# Patient Record
Sex: Male | Born: 1940
Health system: Midwestern US, Academic
[De-identification: ages and names within clinical notes are randomized; demographics above are authoritative.]

---

## 2015-01-19 IMAGING — CT Pelvis^1_PELVIS_WITHOUT (Adult)
1 series · 15 of 32 positions shown, 19 images · non-contrast
Comparison: none

CT OF THE PELVIS
TECHNIQUE: Multiple standard axial reference planes without contrast.

[Series 2: pelvis without 5.0 soft tissue · axial · non-contrast · 0.71mm/px · z∈[-335,-35]mm · 15 of 68 slices shown, 19 images]
[im 5/68  soft-tissue]
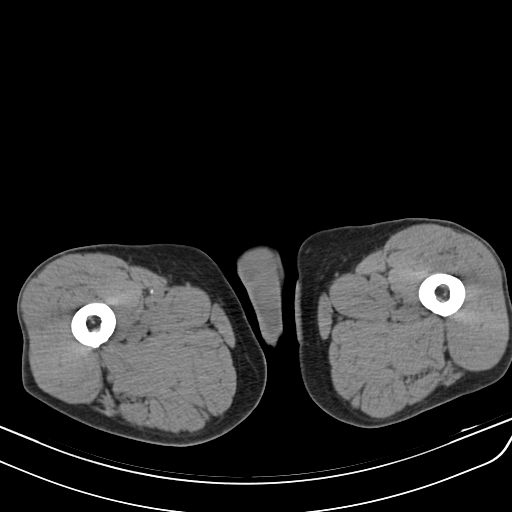
[im 5/68  bone]
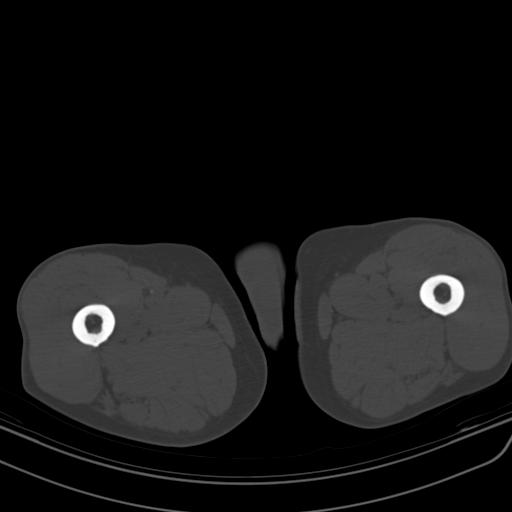
[im 9/68  soft-tissue]
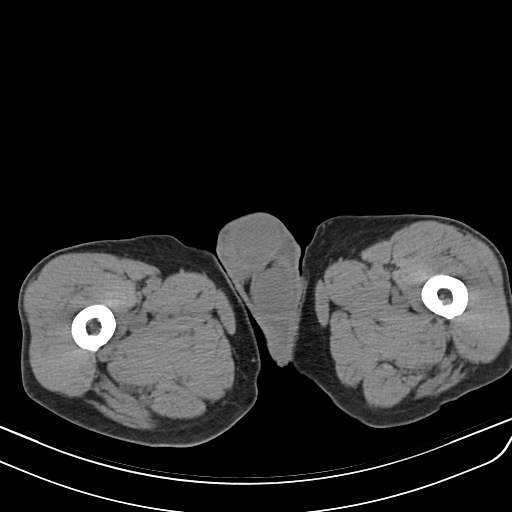
[im 13/68  soft-tissue]
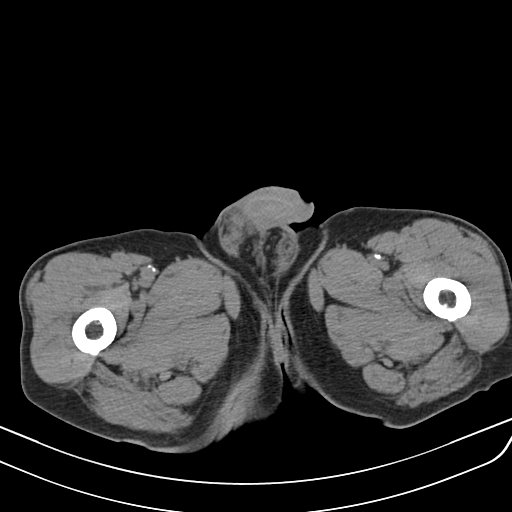
[im 20/68  soft-tissue]
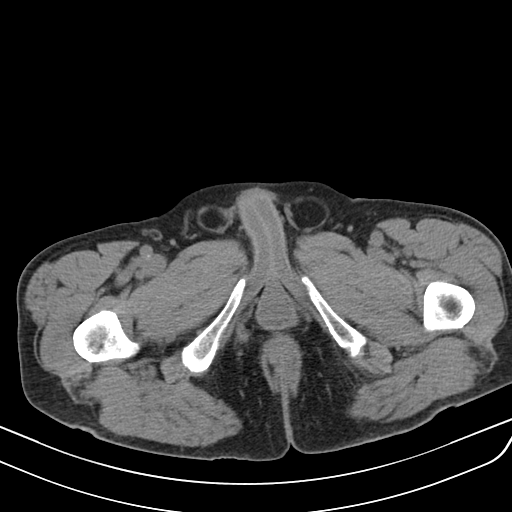
[im 24/68  soft-tissue]
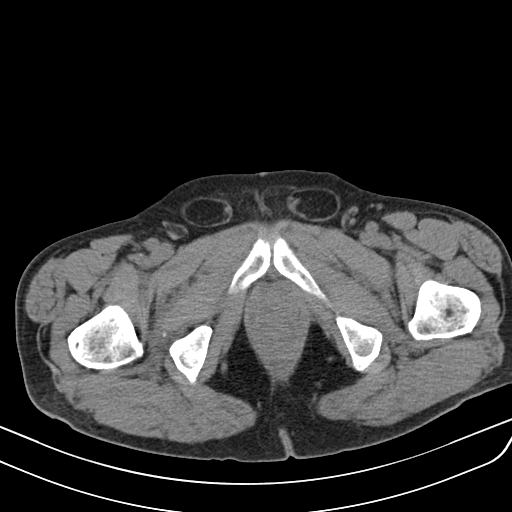
[im 29/68  soft-tissue]
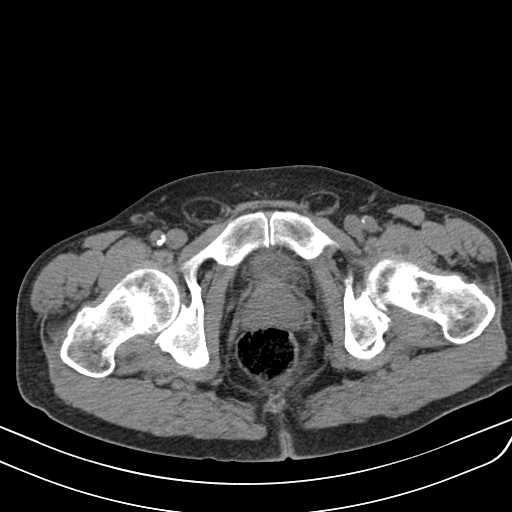
[im 35/68  soft-tissue]
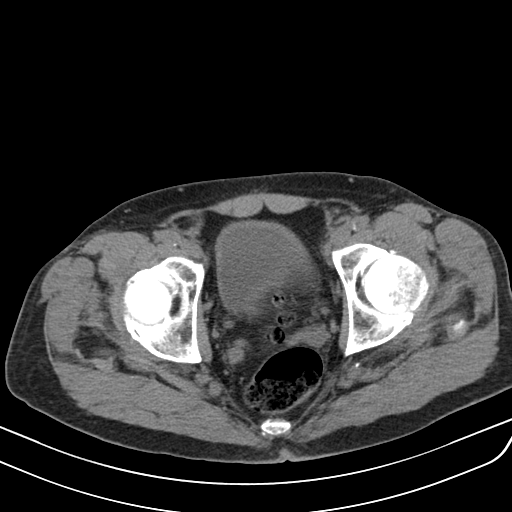
[im 39/68  soft-tissue]
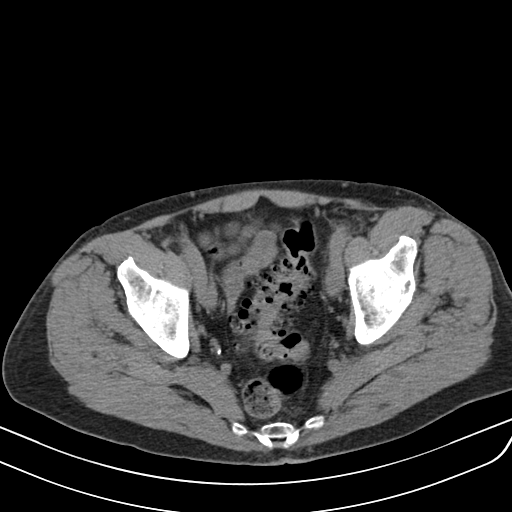
[im 44/68  soft-tissue]
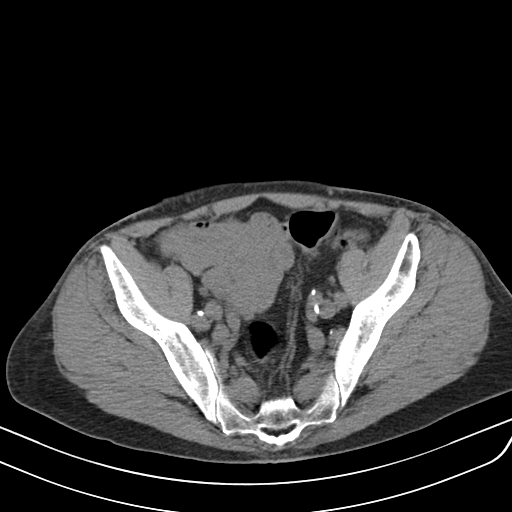
[im 44/68  bone]
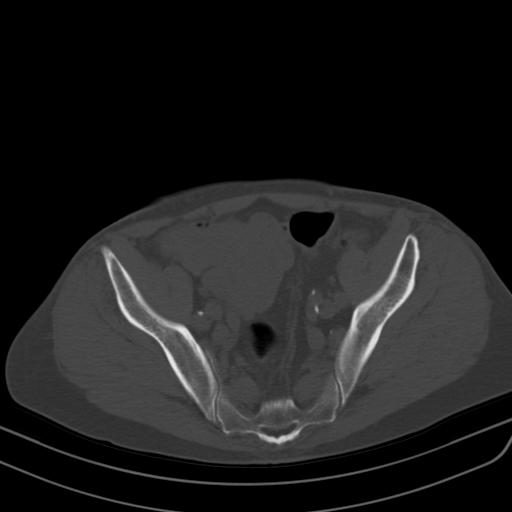
[im 48/68  soft-tissue]
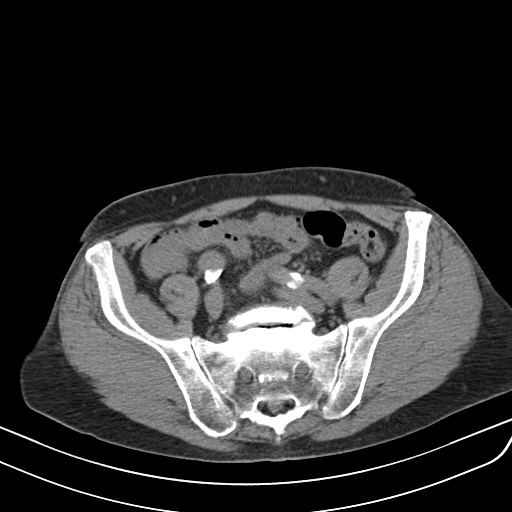
[im 55/68  soft-tissue]
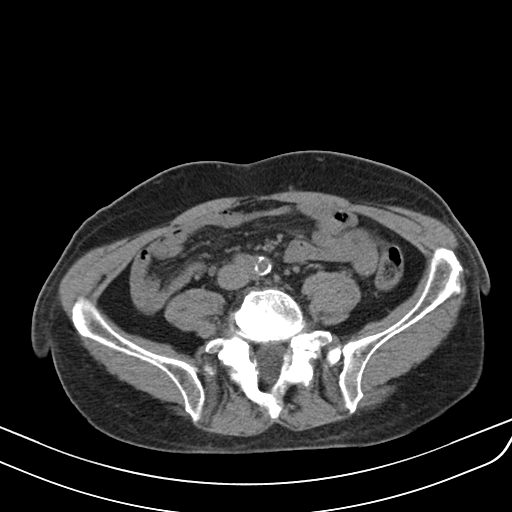
[im 59/68  soft-tissue]
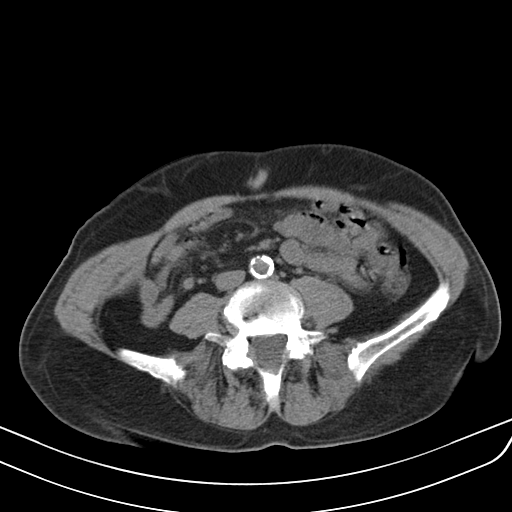
[im 59/68  lung]
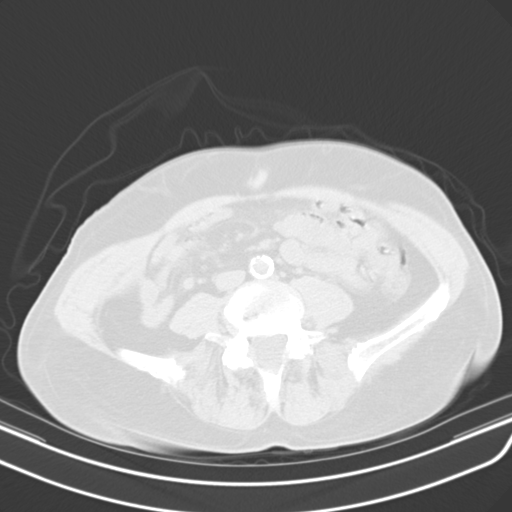
[im 61/68  lung]
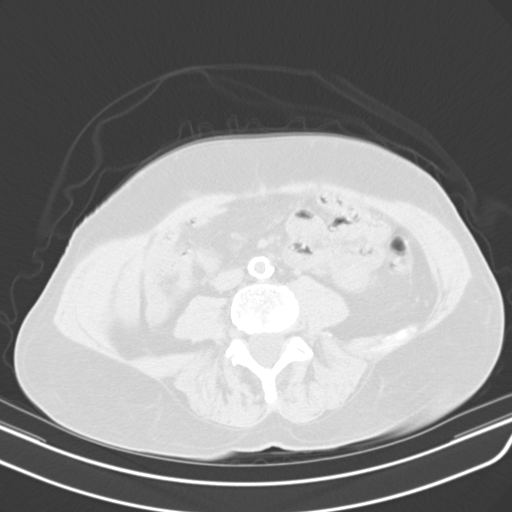
[im 63/68  soft-tissue]
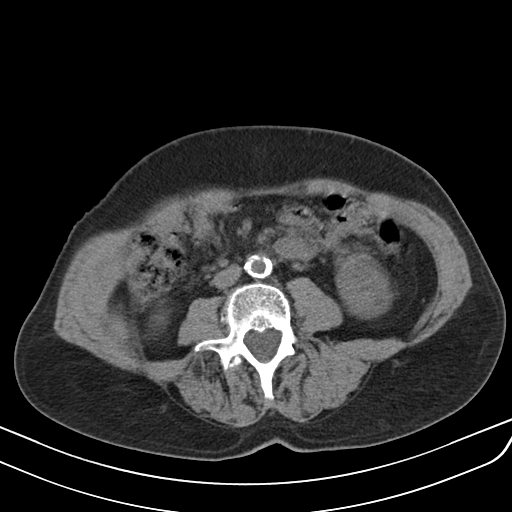
[im 63/68  lung]
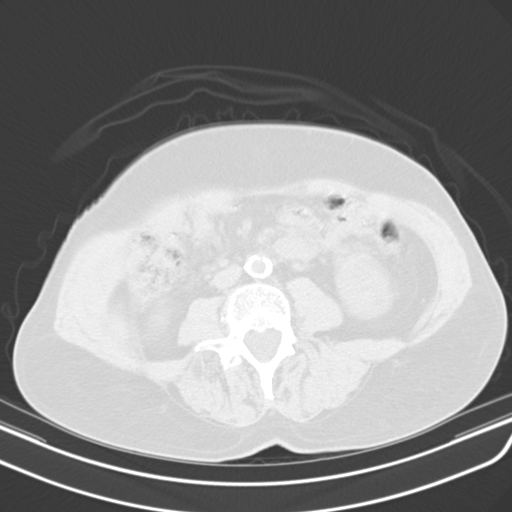
[im 65/68  lung]
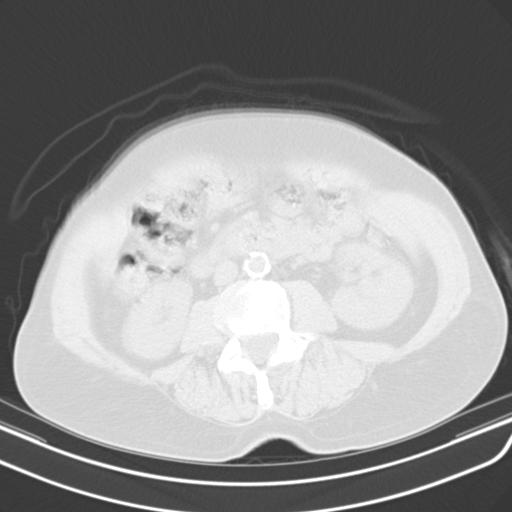

[15 of 32 positions shown; findings below may reference images not displayed]

IMPRESSION: Persistent increased soft tissue density at the level of the anus similar to
the past study
New small 7 mm gas filled pockets along the medial aspect of the left
buttocks consistent with a small abscess.
No larger fluid collection is seen.

INDICATIONS:  Perirectal abscess.
FINDINGS: There are stable moderate to severe sigmoid diverticulosis.
No acute inflammatory changes of bowel are seen.
There is a partially defined increased density in the perianal region similar
in size to the previous examination from February 15, 2014.
There is a 7 mm gas pocket identified along the medial aspect the left
buttocks compatible with a small gas filled abscess.
No larger fluid collection is seen.
There is mild prostatic enlargement.
This indents the bladder base.
There is marked disc space narrowing and sclerosis opposing the bony margins
at L4-5
There is vacuum formation at L5-S1 with grade 1 anterolisthesis and bilateral
isthmus defect.
There is spina bifida occulta of S1.
There is dense atheromatous disease of the aorta and its branches.
No free intraperitoneal air or fluid is seen.
No focal mesenteric edema or adenopathy is seen.
There are stable small-to-moderate sized fat containing uncomplicated
inguinal hernias.

Job: [id number]

Tech Notes: PERIRECTAL ABSCESS LEFT SIDE X [id number] -AK

## 2016-12-09 IMAGING — CR PELVIS
3 series · 3 of 3 positions shown · non-contrast
Comparison: none

[pelvis]
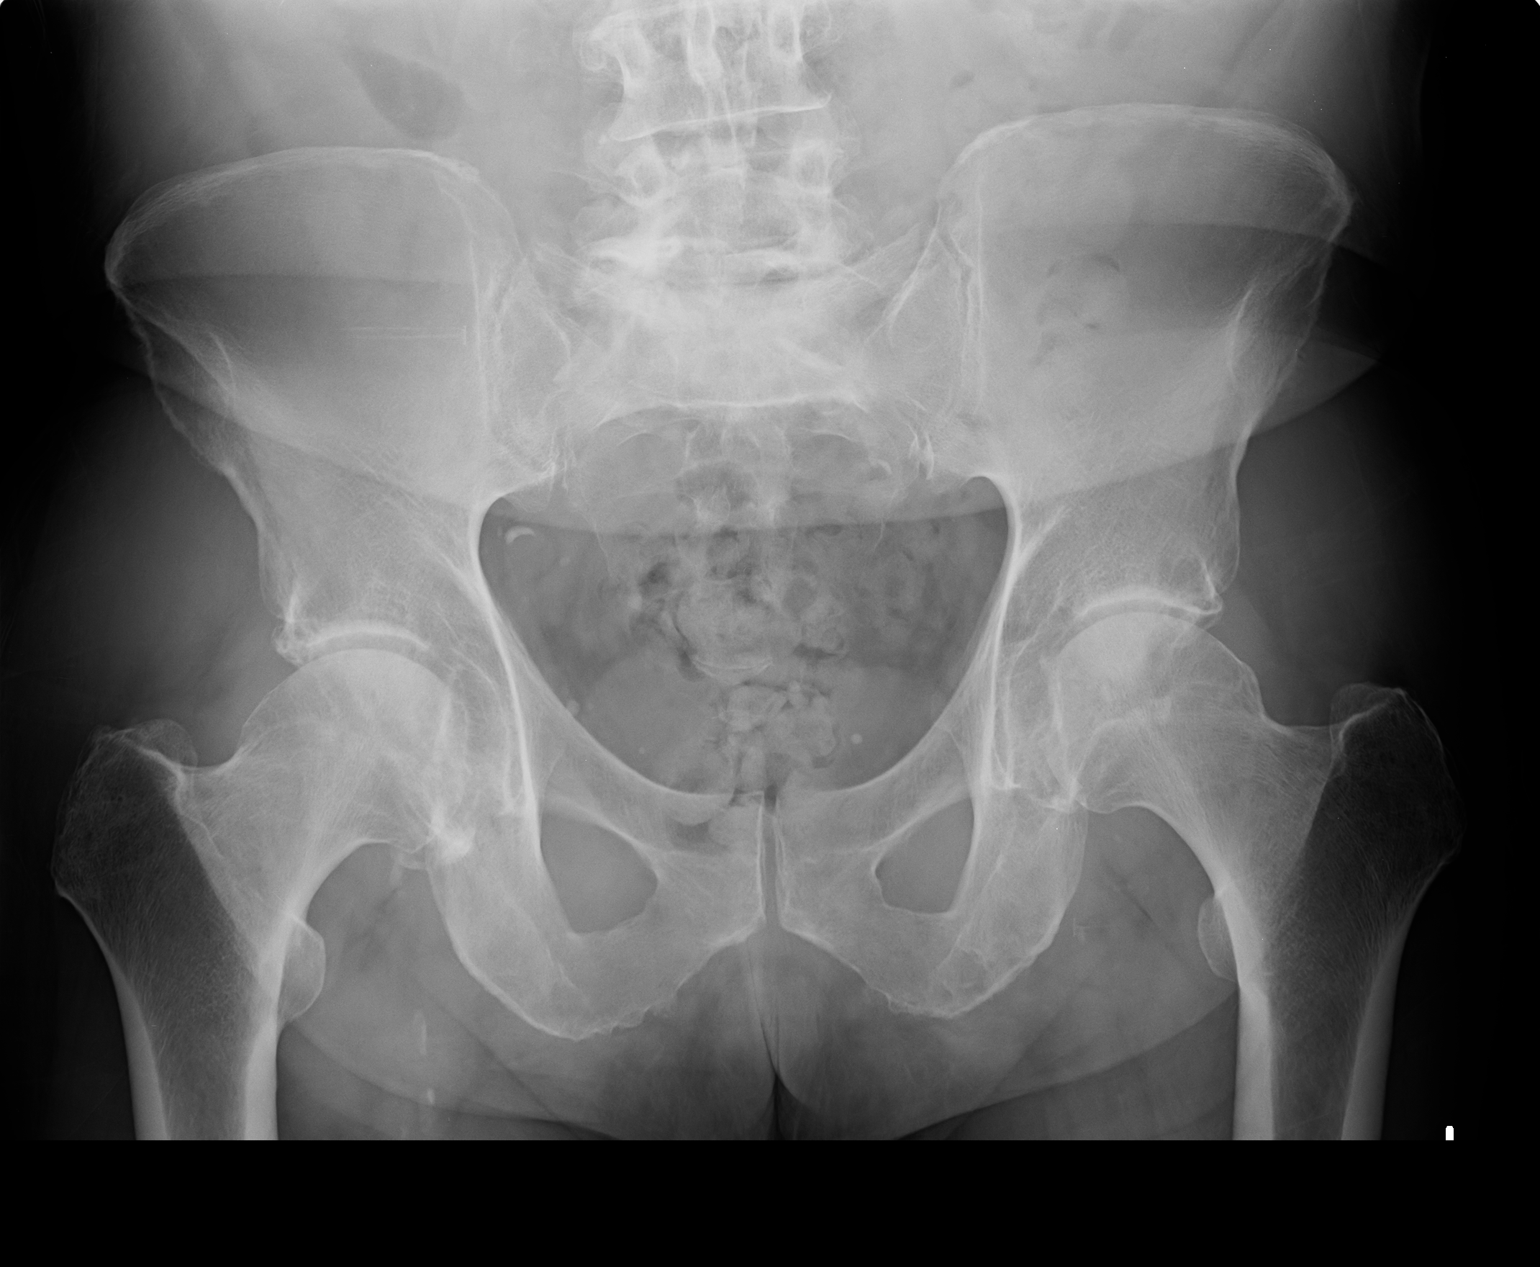

[hip ap]
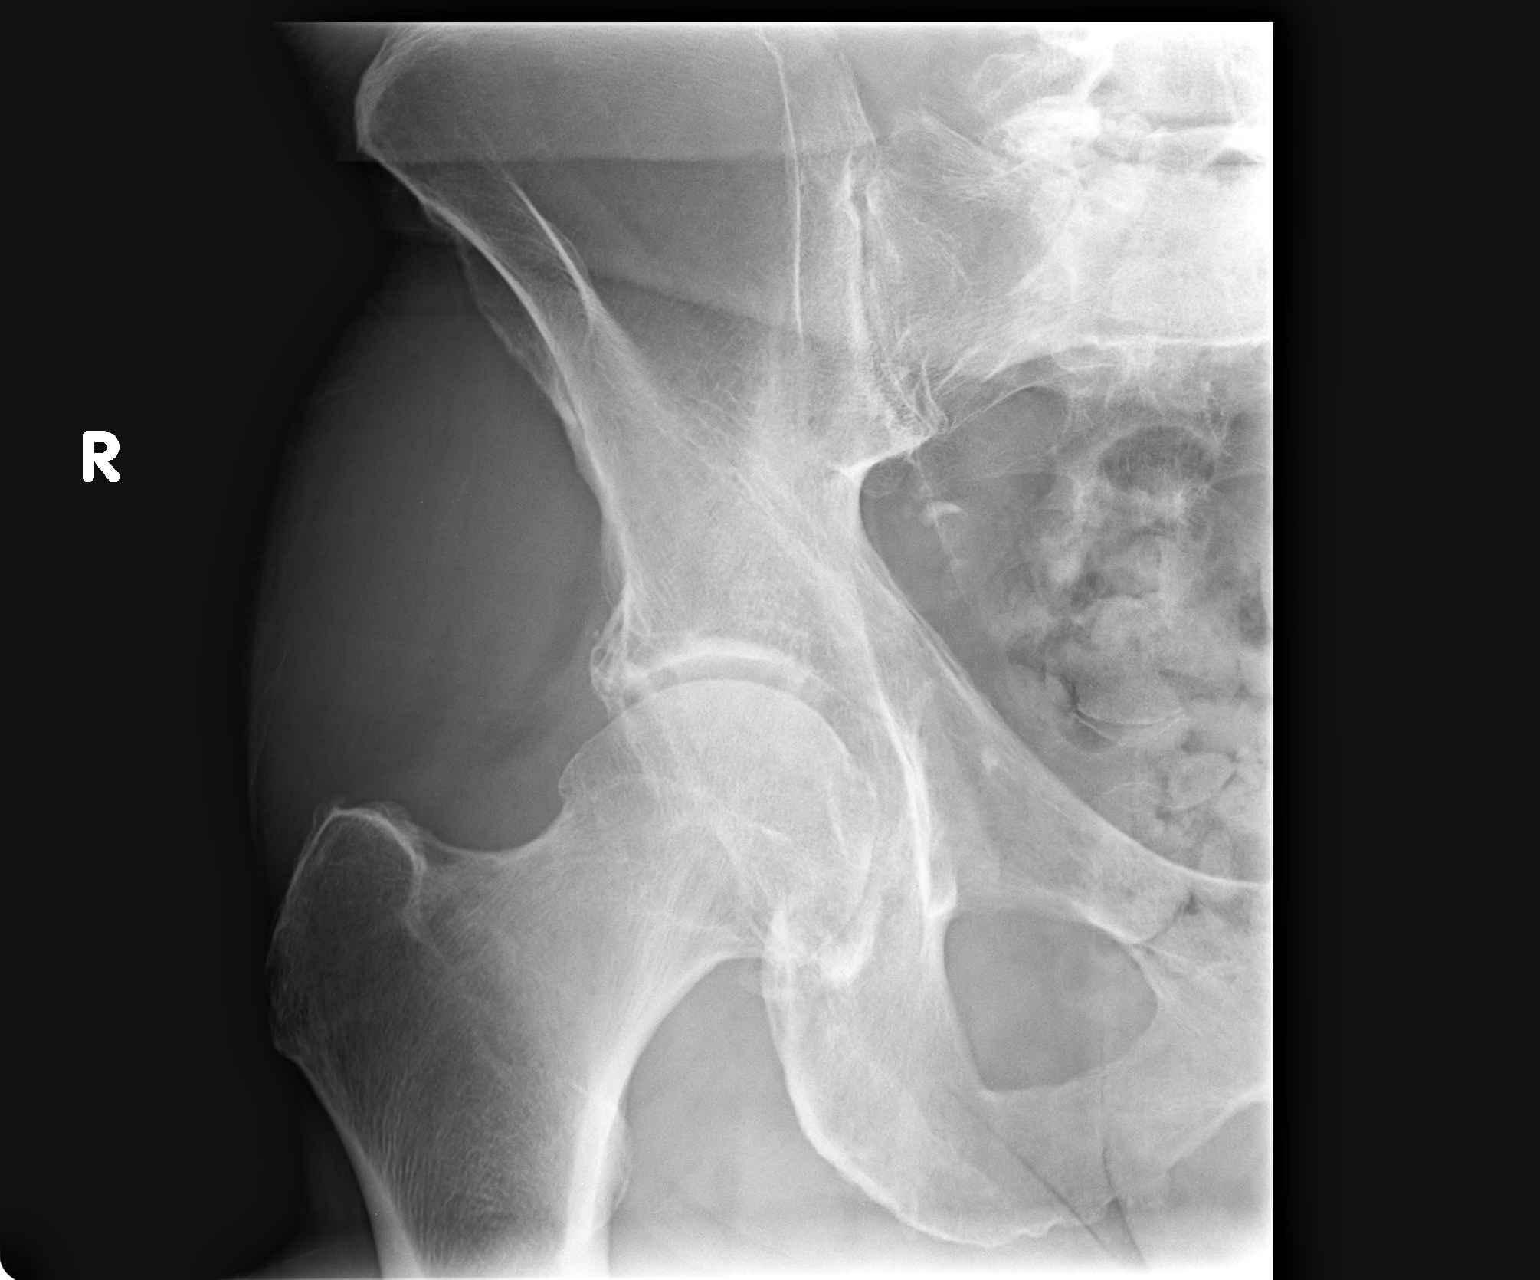

[hip frog]
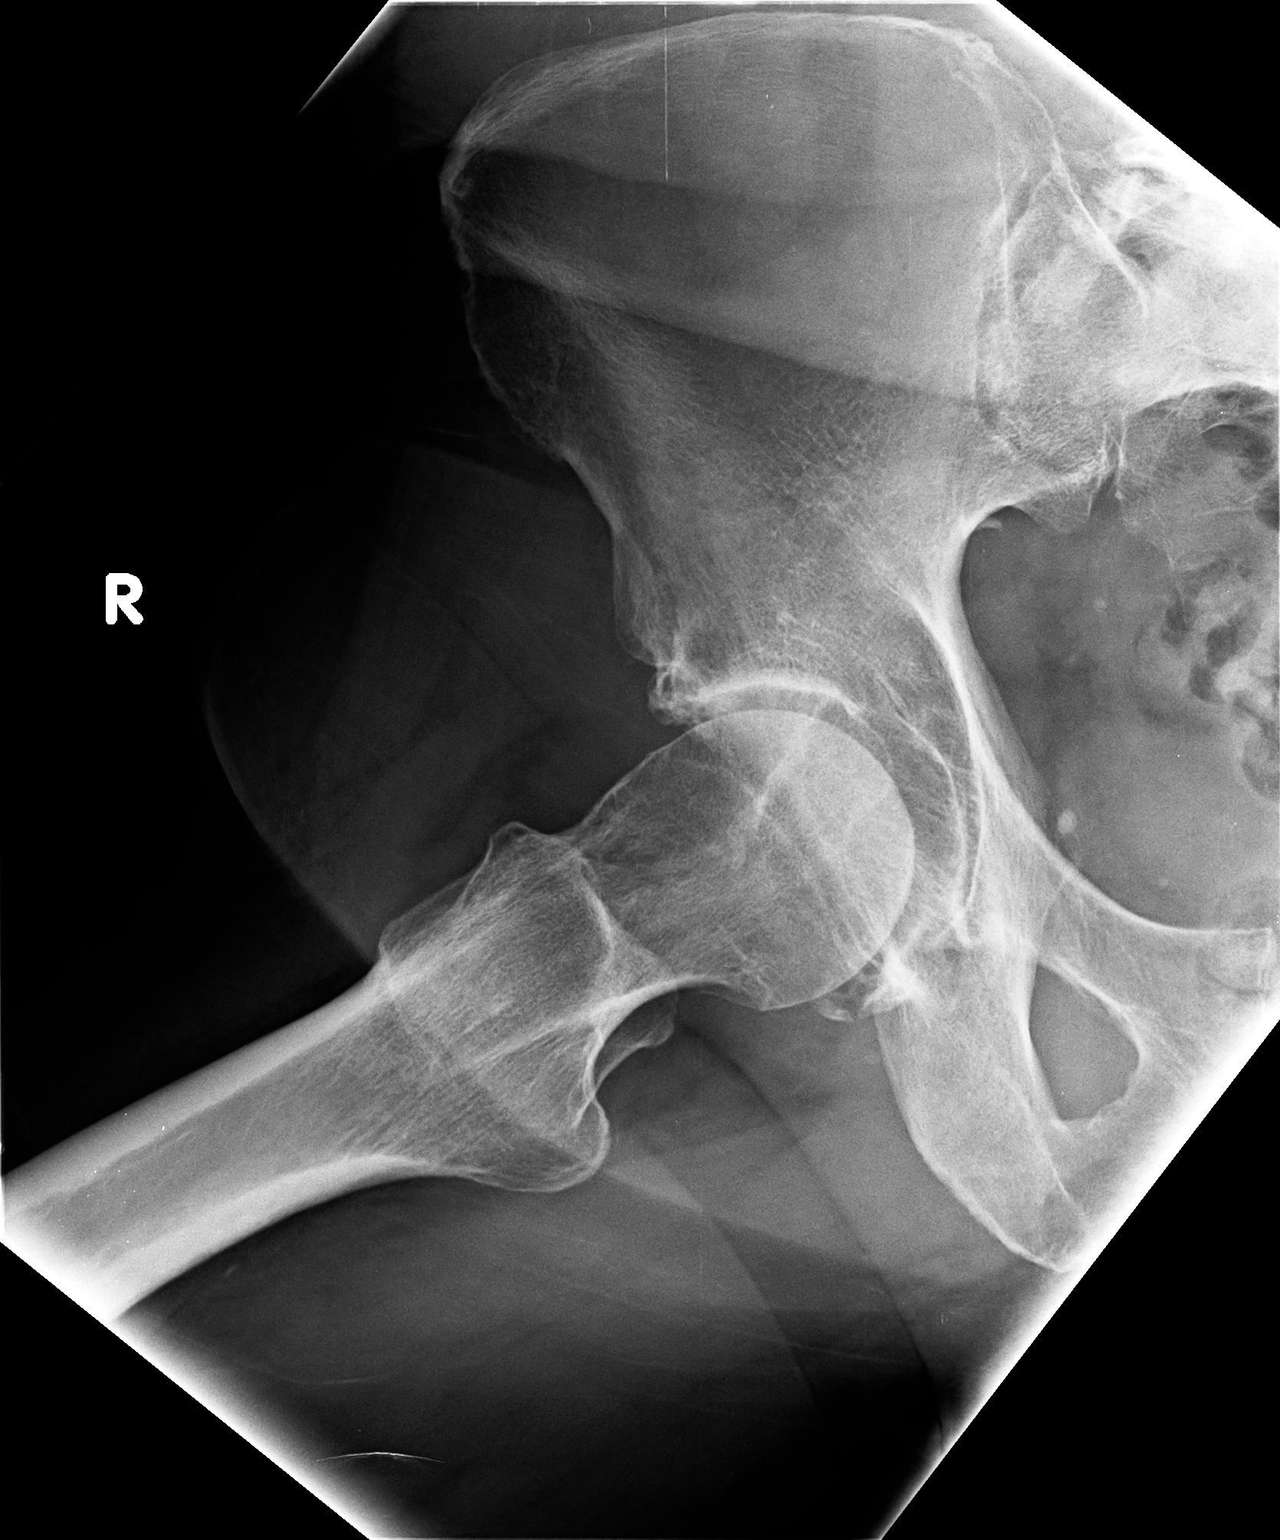

[3 of 3 positions shown; findings below may reference images not displayed]

EXAM

Right hip.

INDICATION

TENDINTIS RIGHT BUTTOCK
TENDINTIS RIGHT BUTTOCK

FINDINGS

Two views of the right hip were obtained.

There are moderately sized osteophytes of the right acetabulum and right femoral head. There is no
evidence of fracture or dislocation.

IMPRESSION

There is moderate osteoarthrosis of the right hip. No fracture or acute appearing abnormality is
identified.

## 2017-12-17 IMAGING — CR LOW_EXM
3 series · 3 of 3 positions shown · non-contrast
Comparison: none

[foot]
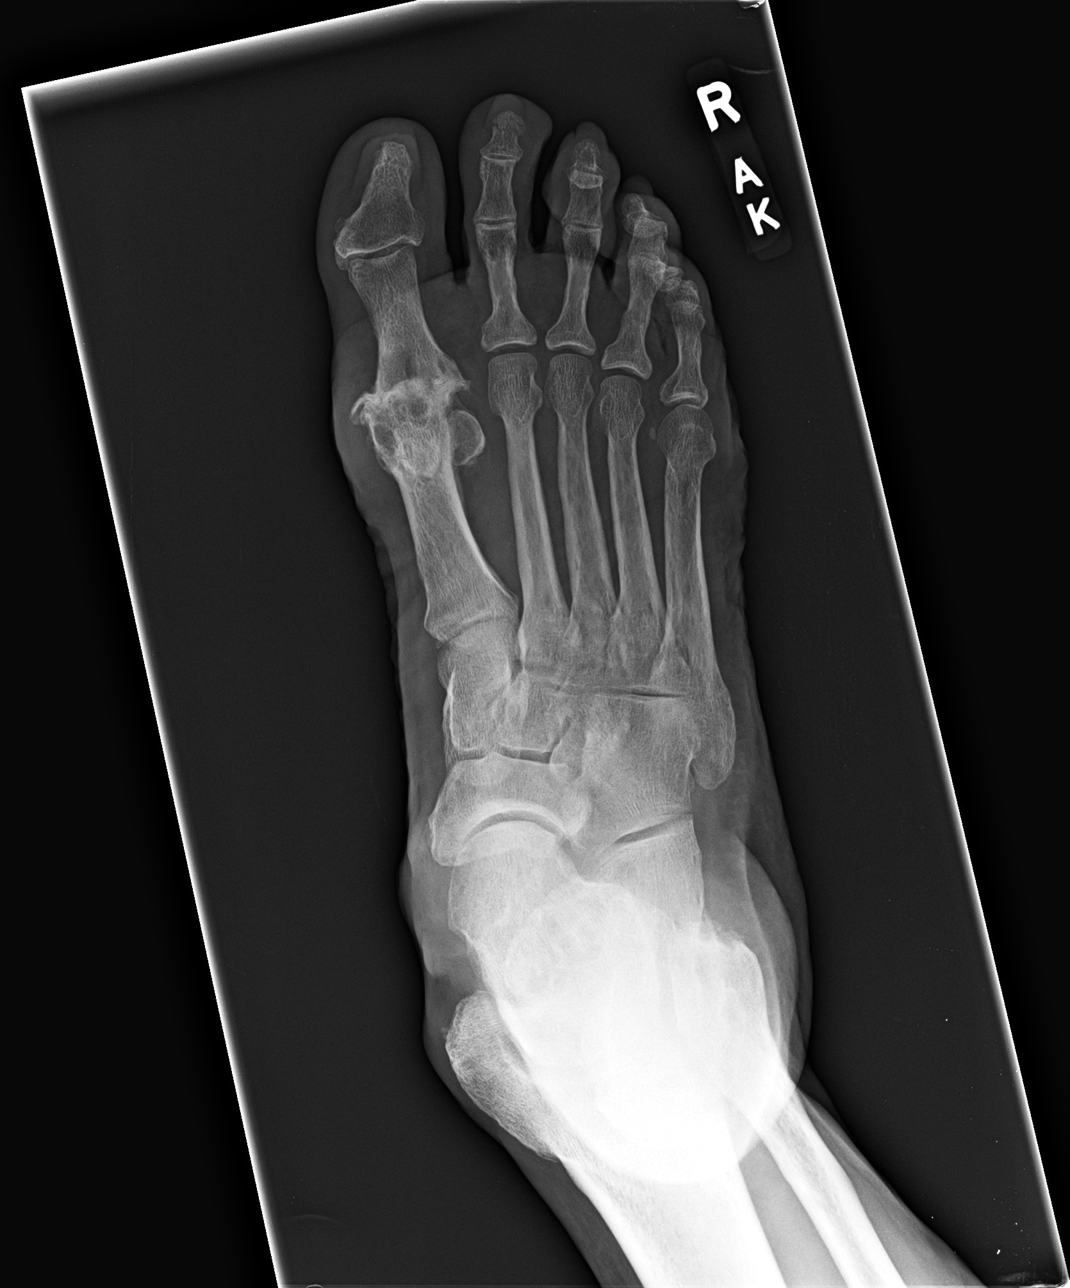

[foot obl]
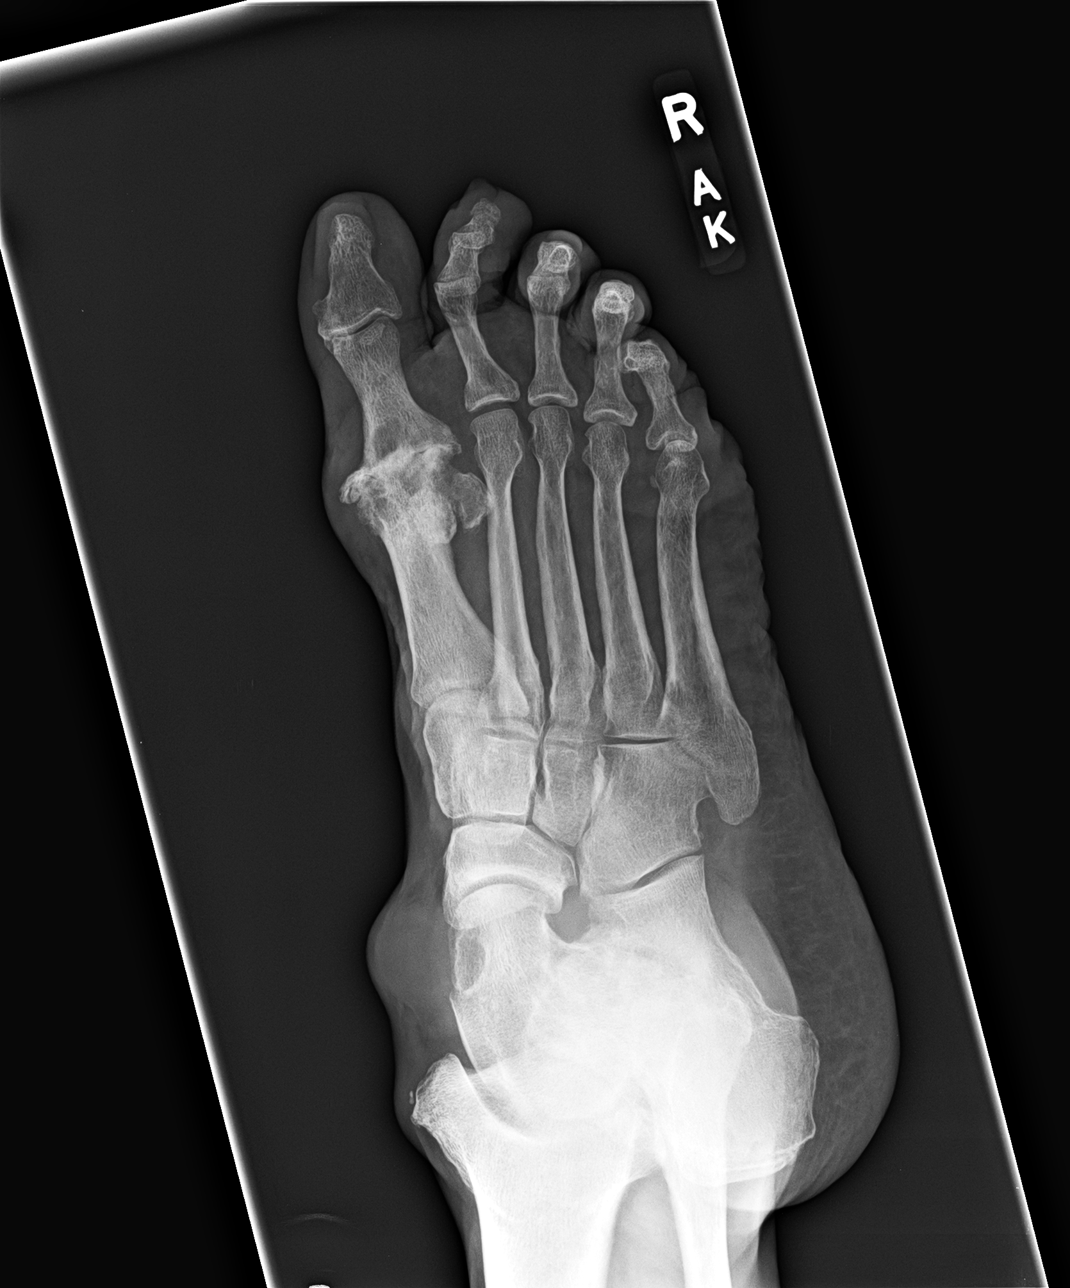

[foot lat]
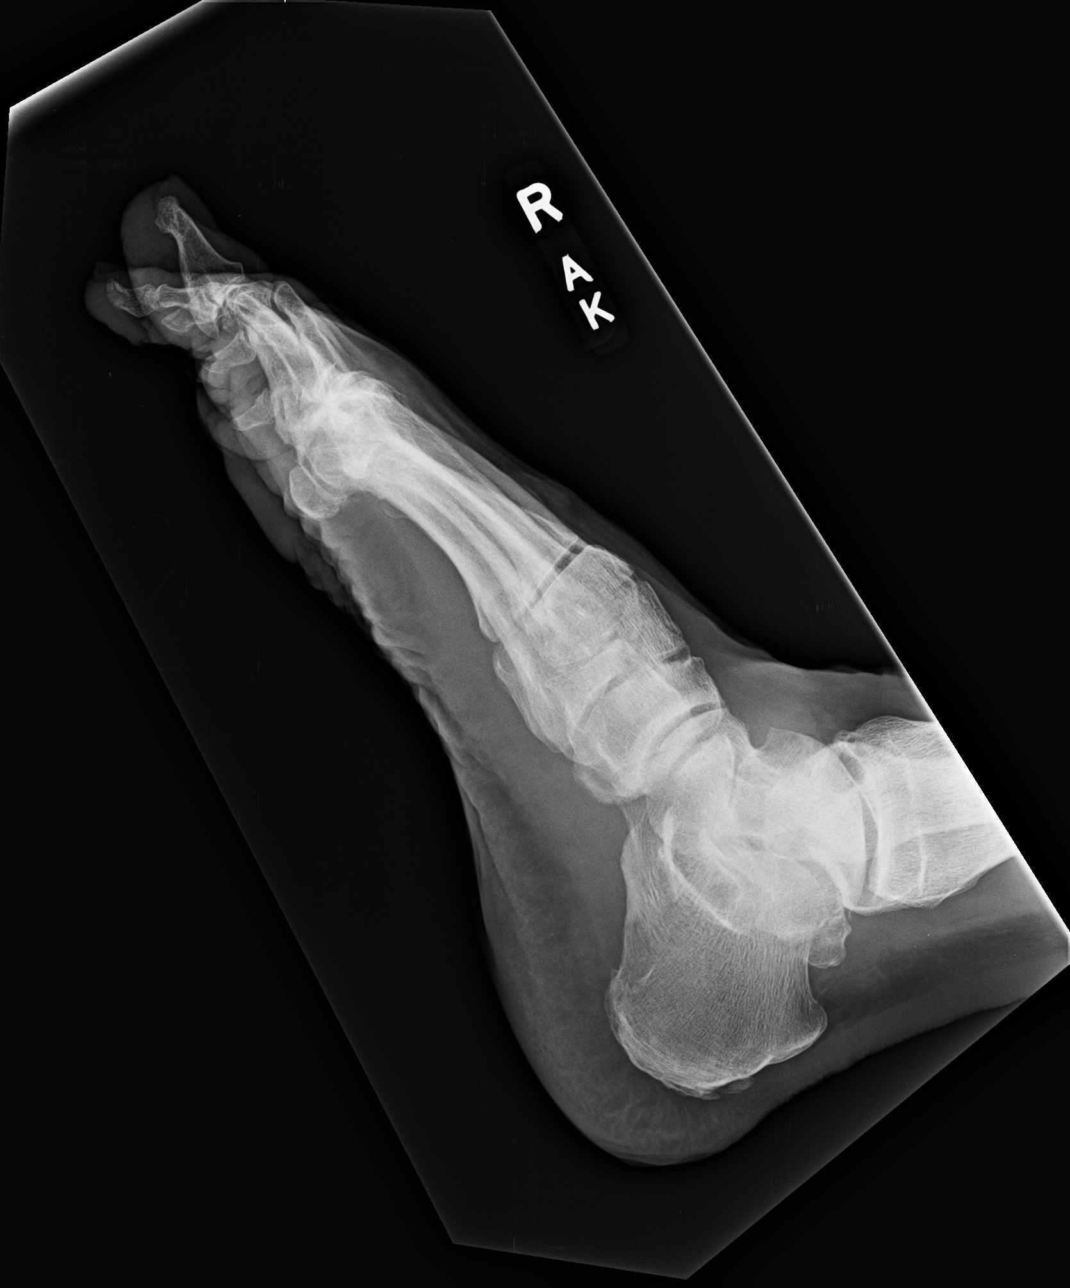

[3 of 3 positions shown; findings below may reference images not displayed]

DIAGNOSTIC STUDIES

EXAM
Right foot radiographs.

INDICATION
right foot swelling
right foot pain/swelling - pt c/o a knot on the 1st metatarsal, and another near the ankle x1 month
- AK

TECHNIQUE
AP, oblique, and lateral right foot views.

COMPARISONS
None.

FINDINGS
Severe 1st MTP osteoarthropathy with bony ankylosis, marginal spurring, and subchondral geodes
formation. Soft tissue thickening over the 1st MTP joint. Diffuse bone demineralization. Tiny c
alcaneal spur. Prominent posterior superior calcaneus which could predispose toward Haglund's
syndrome. Large os trigonum with synchondrosis. Ankle osteoarthropathy. There is a soft tissue
masslike process at the medial superior hindfoot, possibly a large ganglion cyst arising from the
talonavicular joint space.

IMPRESSION
There is no compelling evidence of acutely displaced fracture. There is severe 1st MTP
osteoarthropathy with bony ankylosis and marginal spurring.

Tech Notes:

right foot pain/swelling - pt c/o a knot on the 1st metatarsal, and another near the ankle x1 month
- AK

## 2018-05-21 IMAGING — US RETROPLM
1 series · 14 of 16 positions shown · non-contrast
Comparison: none

[Series 1: us renal/bladder · 14 of 46 slices shown]
[im 1/46]
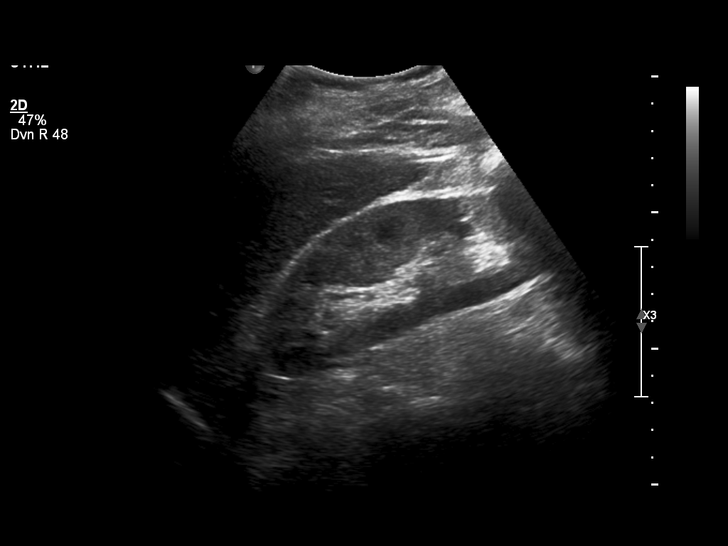
[im 4/46]
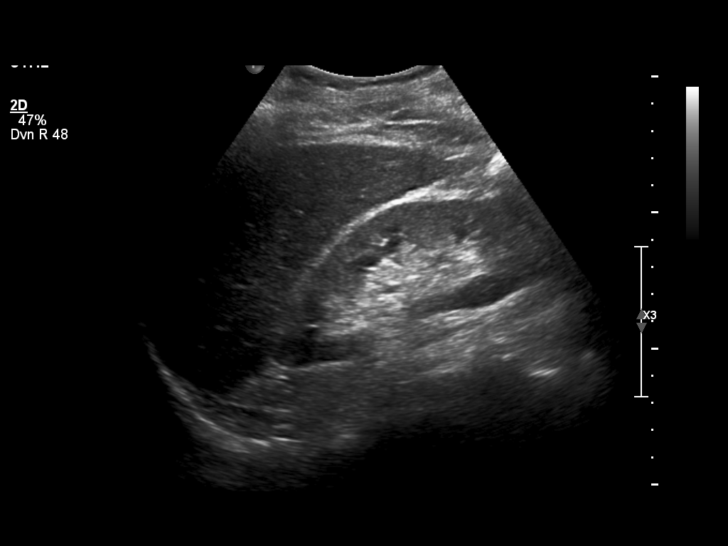
[im 7/46]
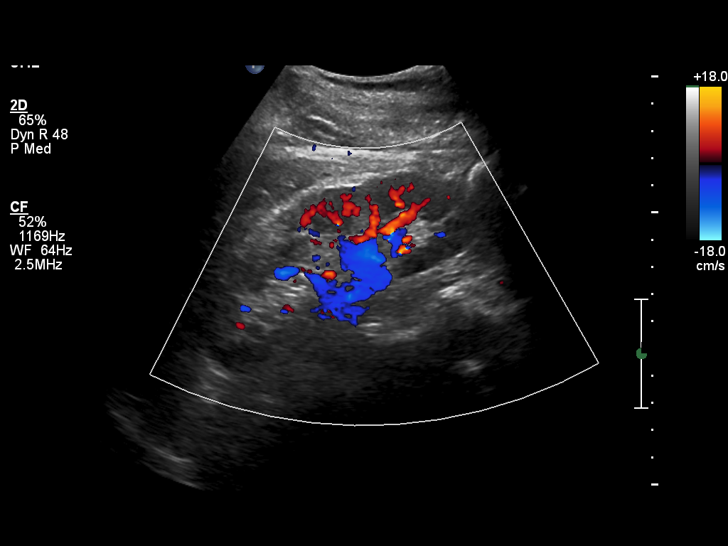
[im 13/46]
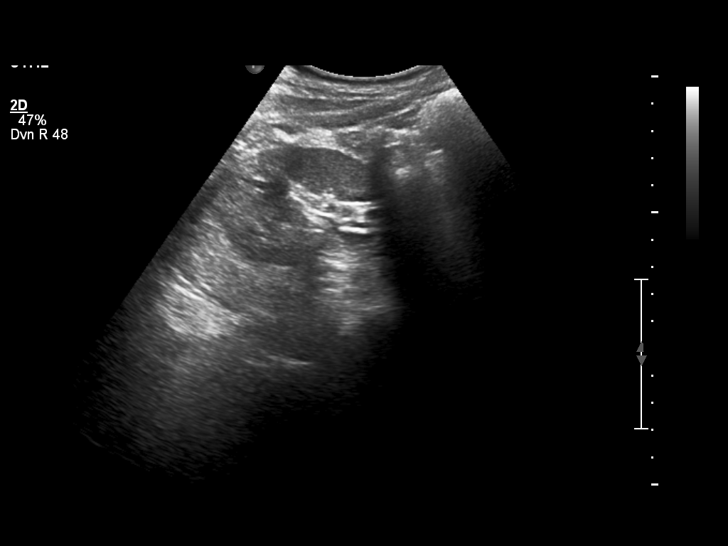
[im 16/46]
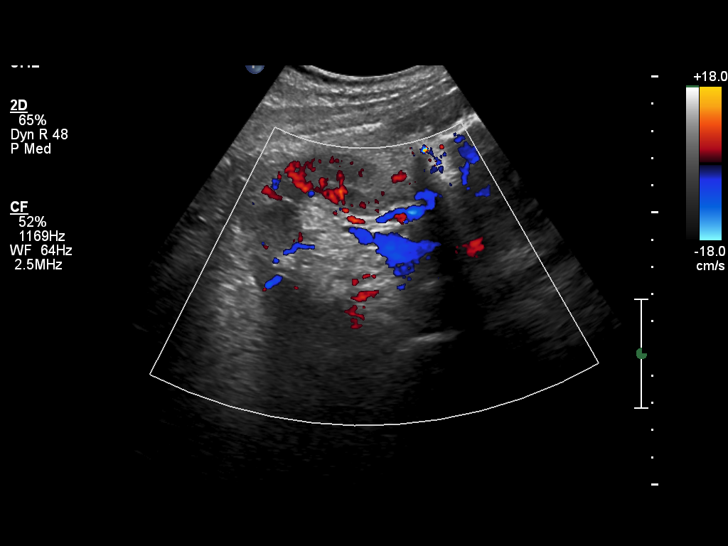
[im 19/46]
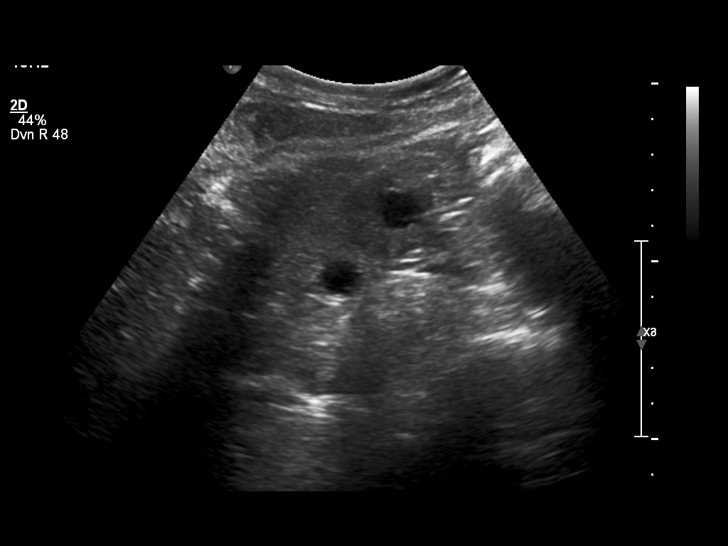
[im 22/46]
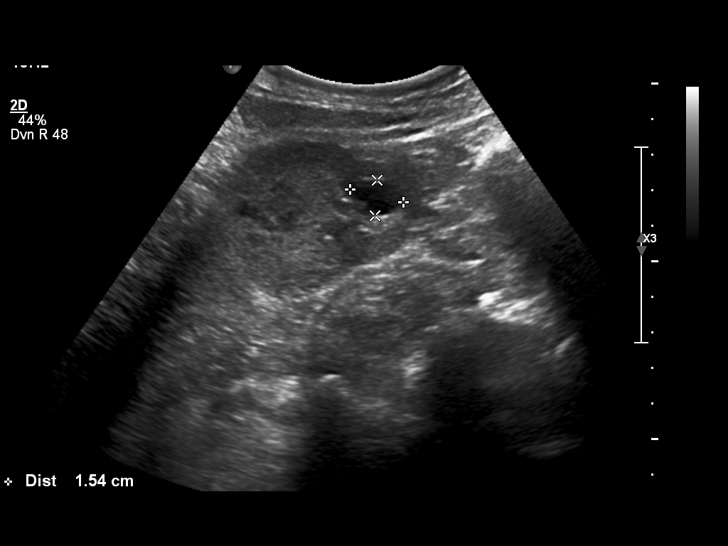
[im 25/46]
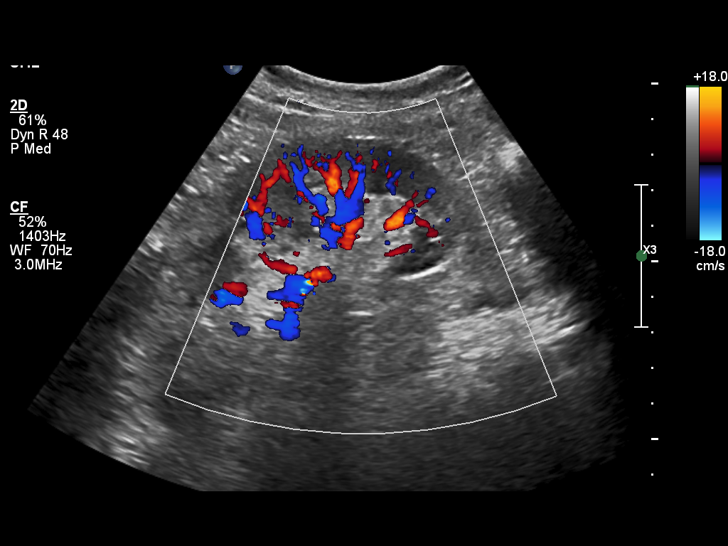
[im 28/46]
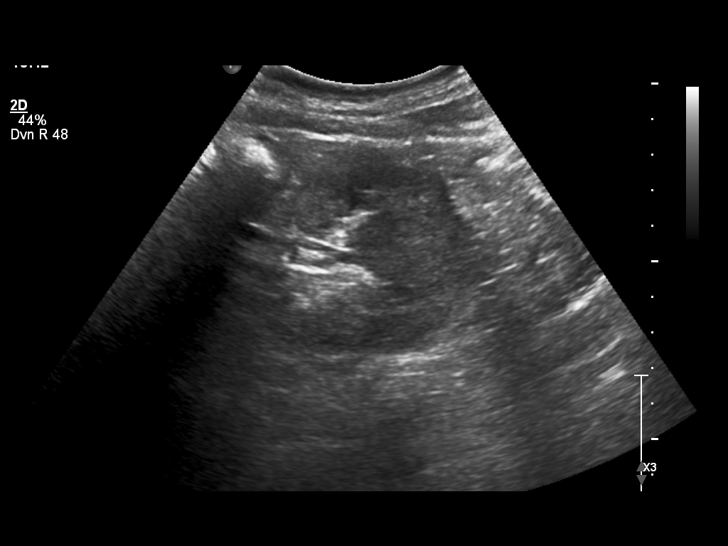
[im 31/46]
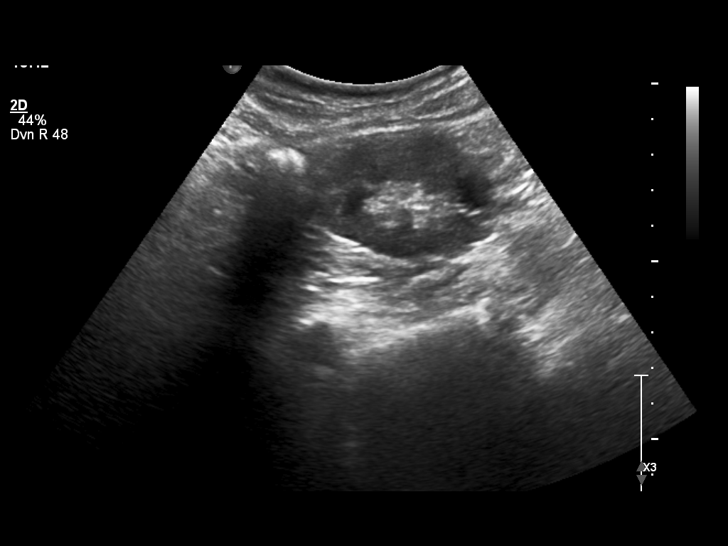
[im 37/46]
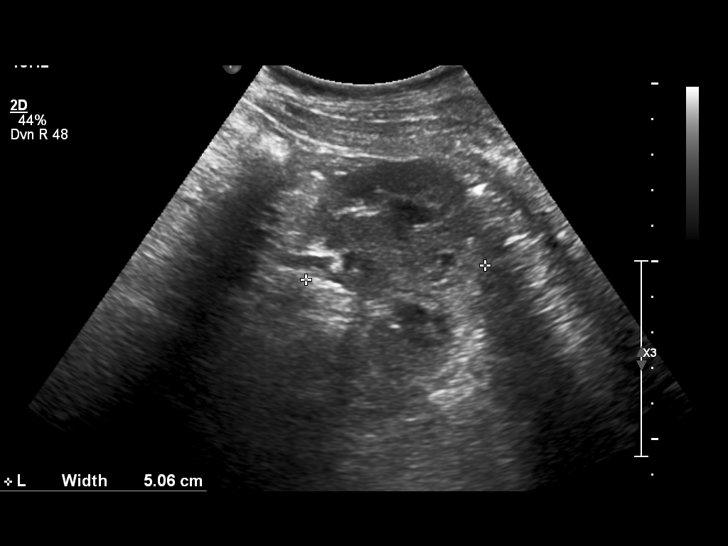
[im 40/46]
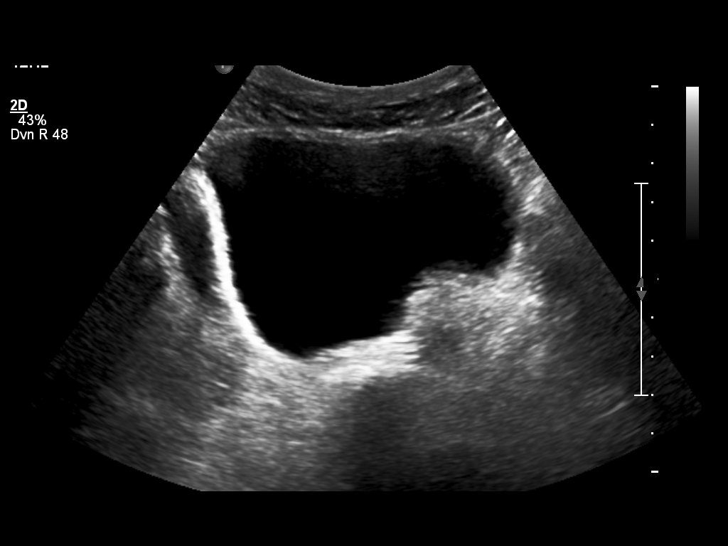
[im 43/46]
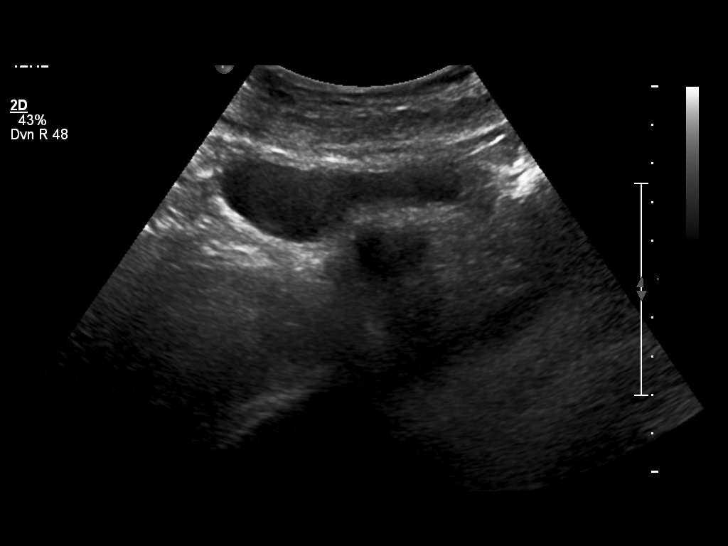
[im 46/46]
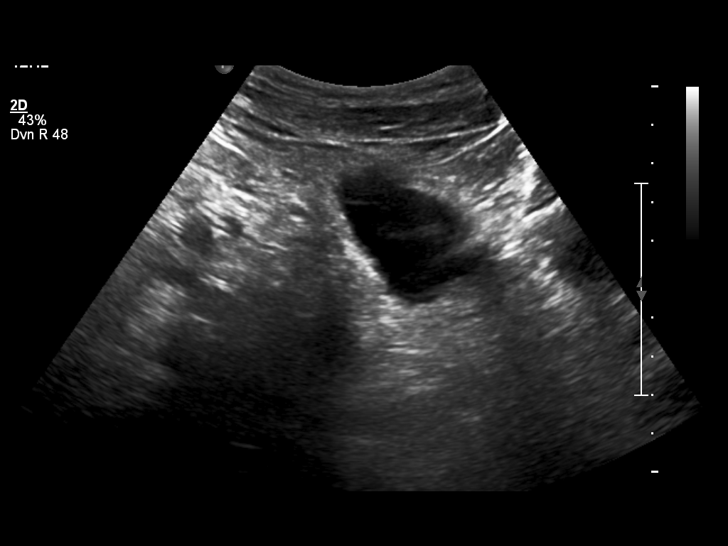

[14 of 16 positions shown; findings below may reference images not displayed]

DIAGNOSTIC STUDIES

EXAM
Renal ultrasound

INDICATION
BPH nocturia
jl

TECHNIQUE
Standard renal ultrasound was obtained.

COMPARISONS
None available

FINDINGS
Right kidney measures 11.9 centimeters in length. No hydronephrosis or mass lesions are seen. Renal
cortex is well preserved.
Left kidney measures 12.2 centimeters in length. Small left renal cysts are seen measuring up
centimeters maximally. Renal cortex is well preserved. No hydronephrosis is evident.

IMPRESSION
Small simple appearing left renal cysts. No hydronephrosis or mass lesions are seen. Renal cortex
is well preserved bilaterally.

Tech Notes:

jl

## 2018-11-02 IMAGING — CR LOW_EXM
3 series · 3 of 3 positions shown · non-contrast
Comparison: none

[ankle ap]
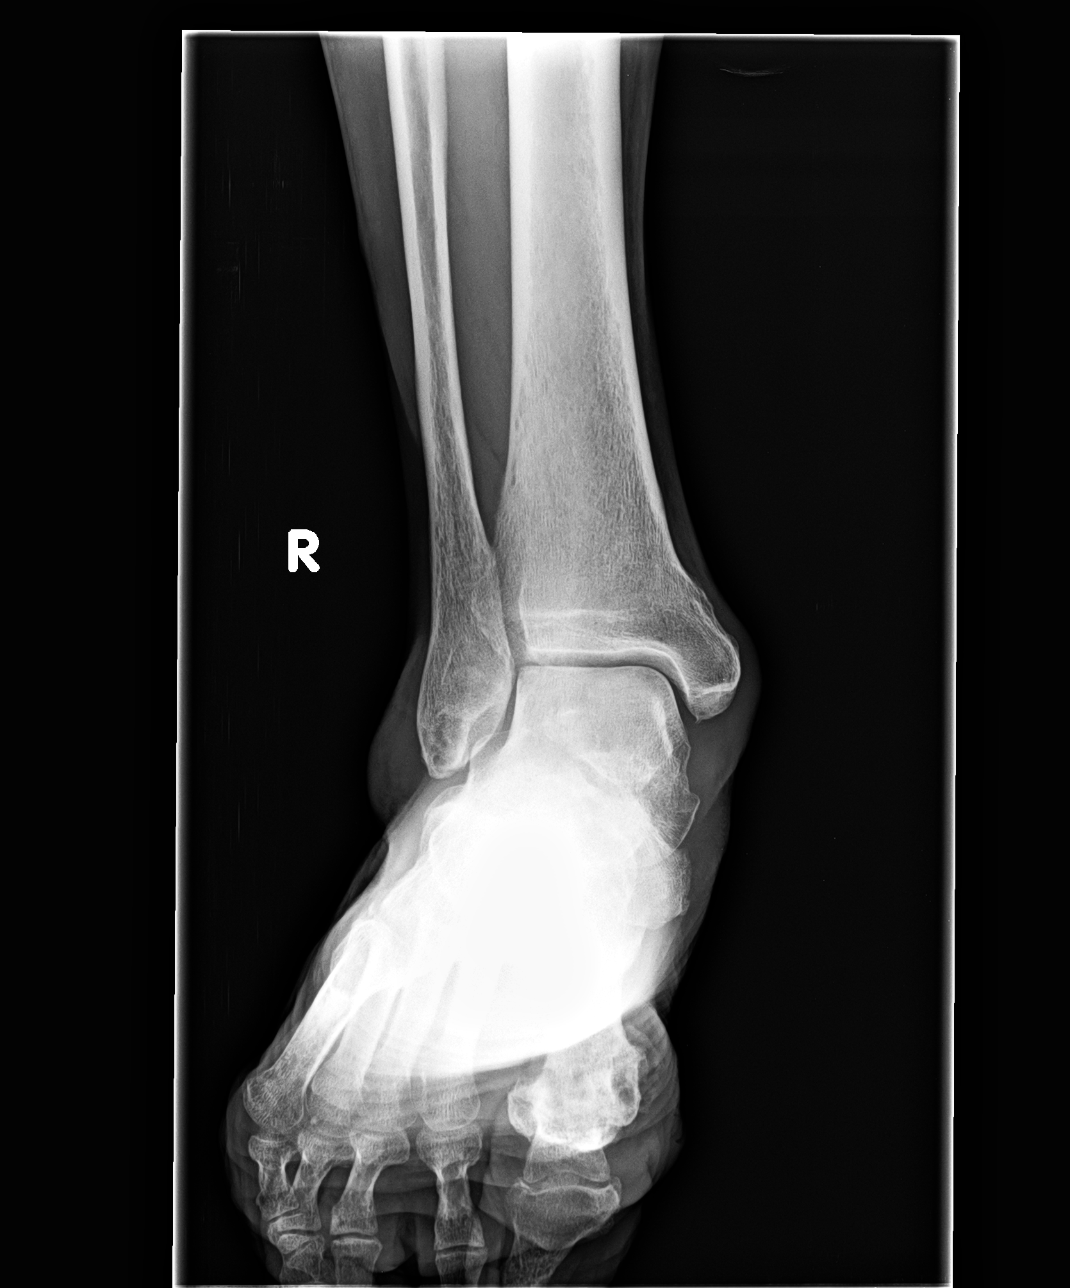

[ankle obl]
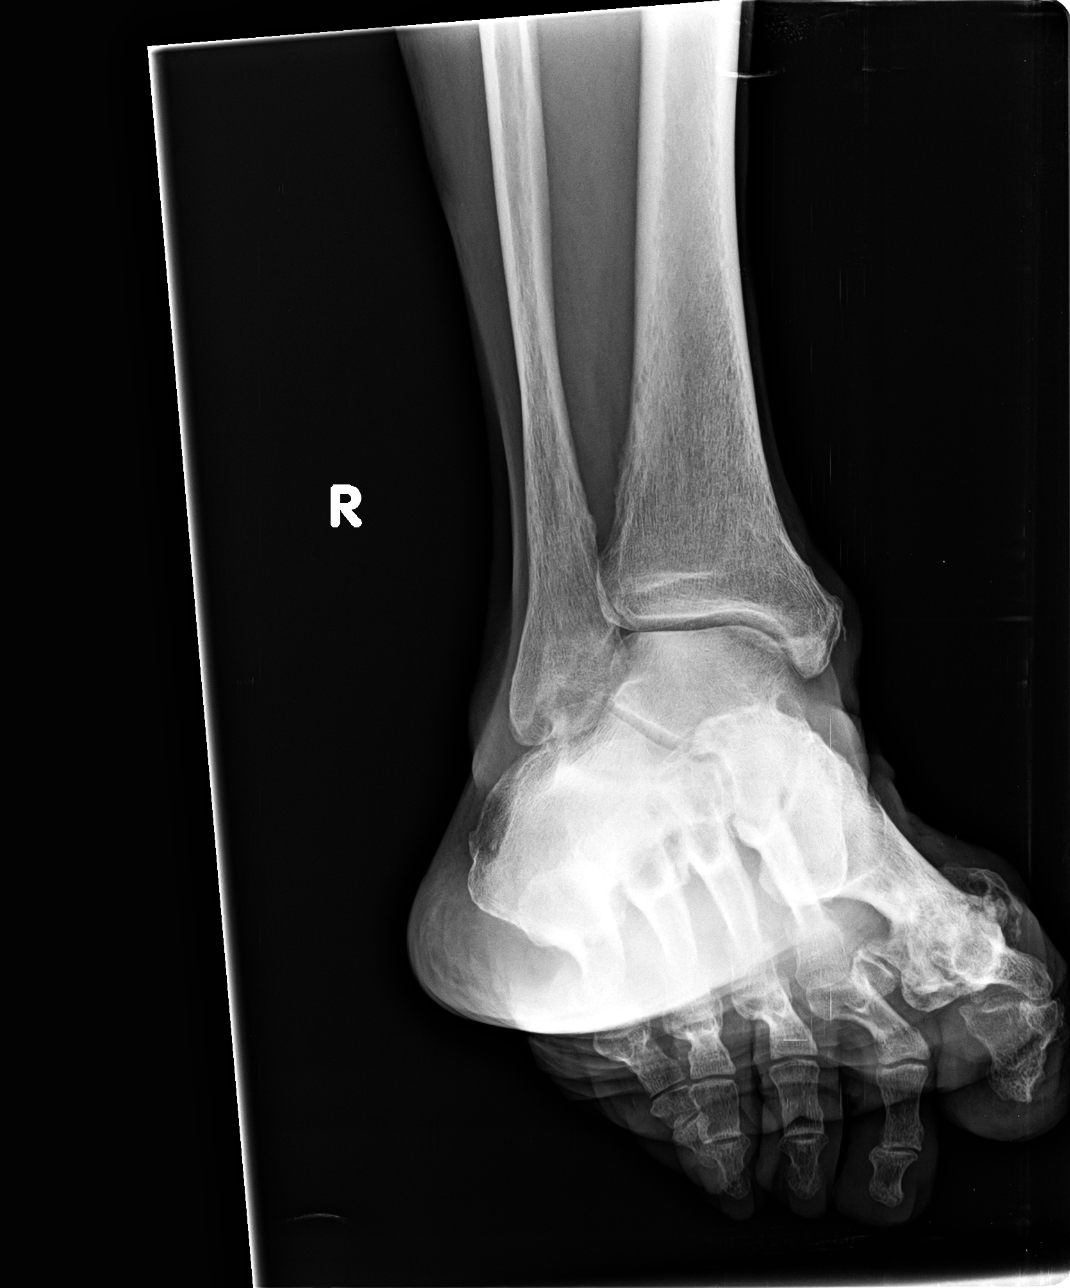

[ankle lat]
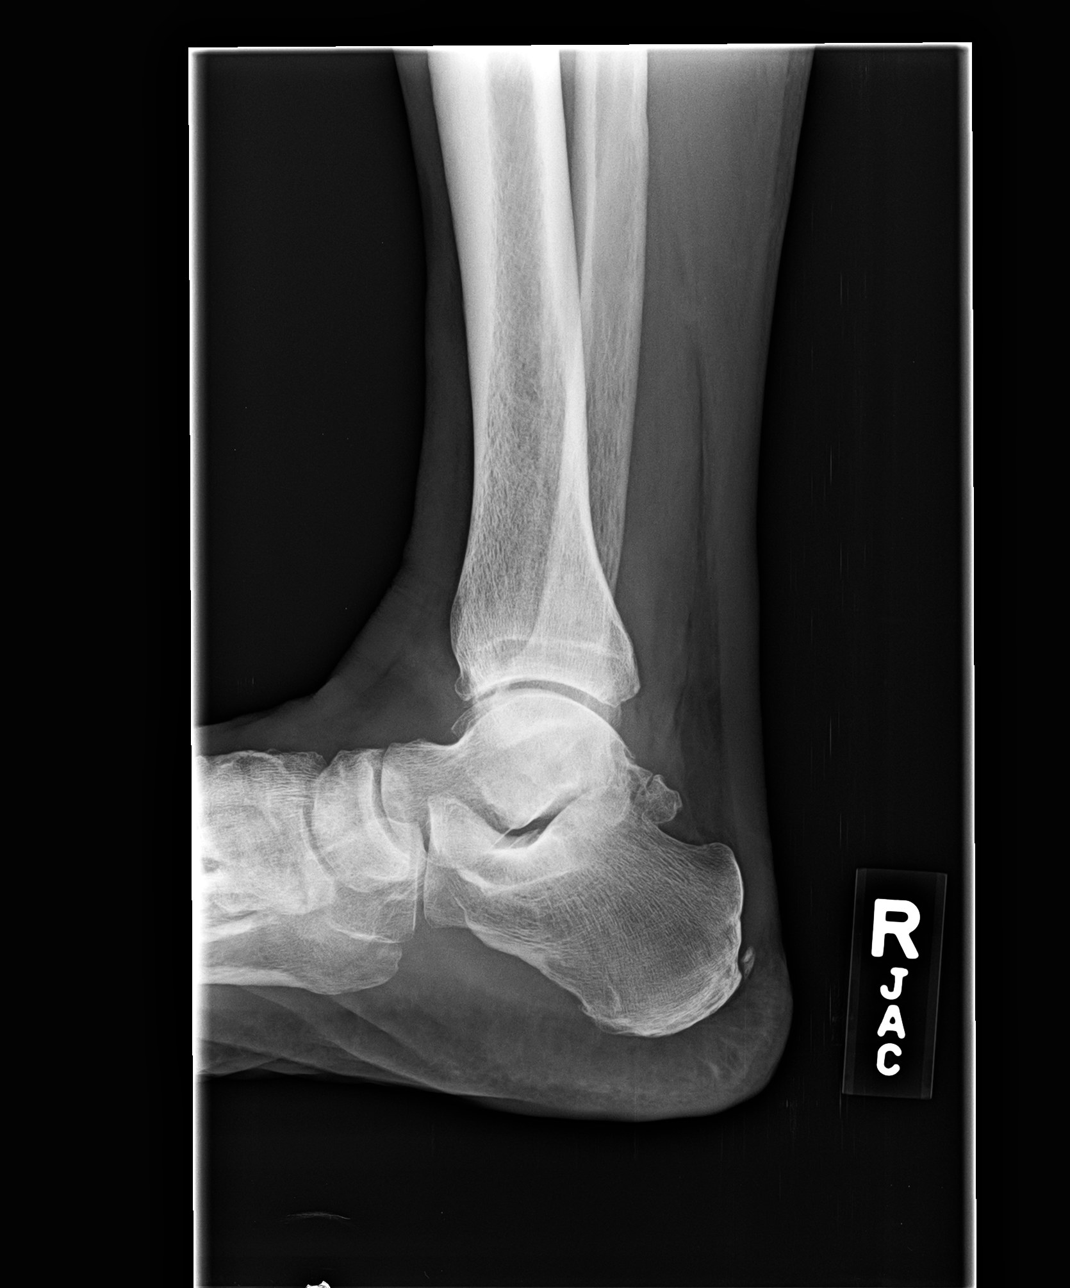

[3 of 3 positions shown; findings below may reference images not displayed]

EXAM

3 views right ankle

INDICATION

ankle fracture
C/O CHRONIC ANKLE PAIN (>5 MONTHS). PAIN ACROSS TOP FOOT. DIFFICULTY BEARING WT. HB

TECHNIQUE

AP, lateral, and oblique views right ankle

COMPARISONS

None available.

FINDINGS

There is moderate degenerative change of the tibiotalar joint with loss of joint space and marginal
osteophyte formation. There is no displaced fracture, dislocation or other acute osseous
abnormality.  There is moderate perimalleolar soft tissue swelling. The ankle mortise is symmetric.
The talar dome is smooth and intact.

IMPRESSION

1.  Moderate degenerative changes of the tibiotalar joint with loss of anterior joint space but
without evidence of displaced fracture. Mild perimalleolar soft tissue swelling is appreciated.

Tech Notes:

C/O CHRONIC ANKLE PAIN (>5 MONTHS). PAIN ACROSS TOP FOOT. DIFFICULTY BEARING WT. HB

## 2020-06-27 IMAGING — CR [ID]
3 series · 3 of 3 positions shown · non-contrast
Comparison: none

[knee ap]
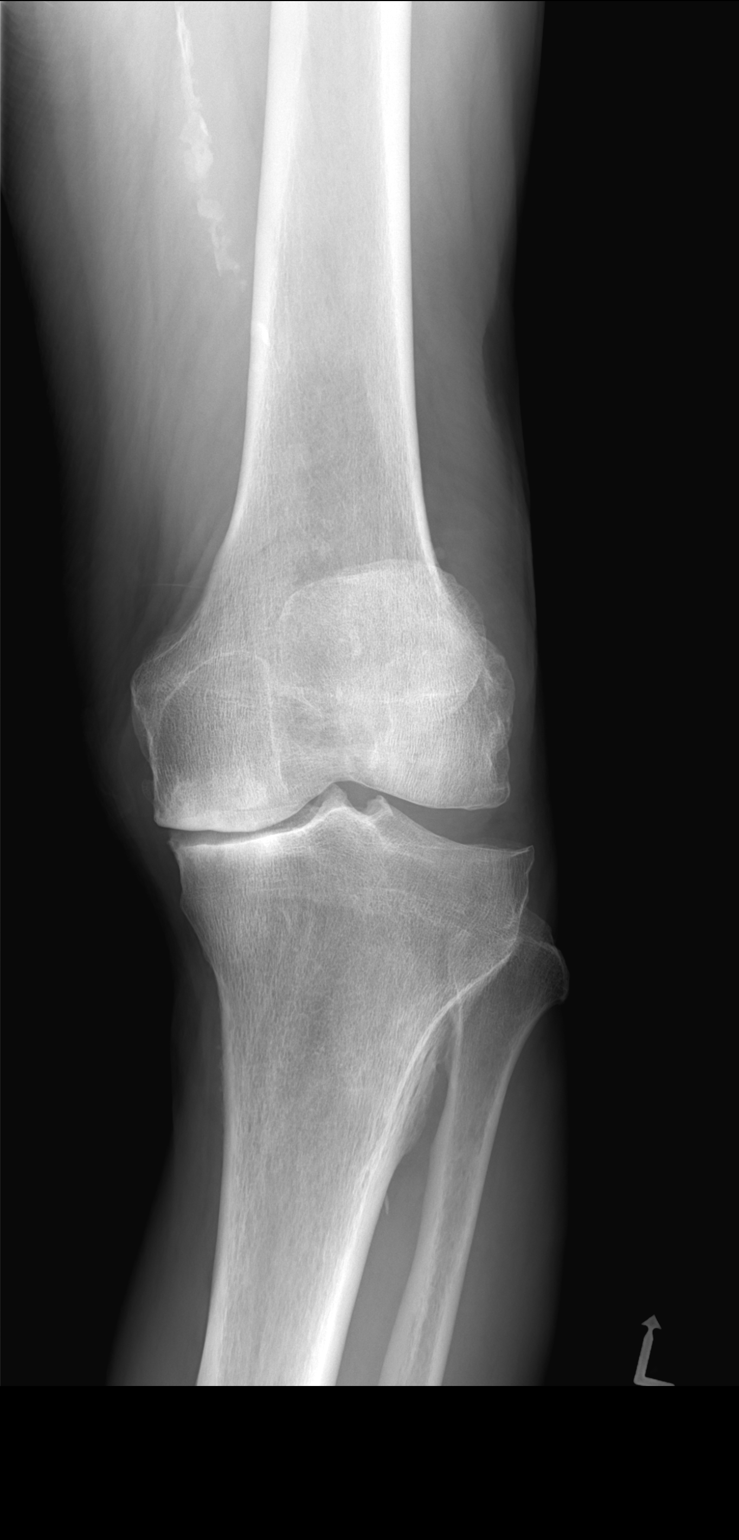

[knee sunrise]
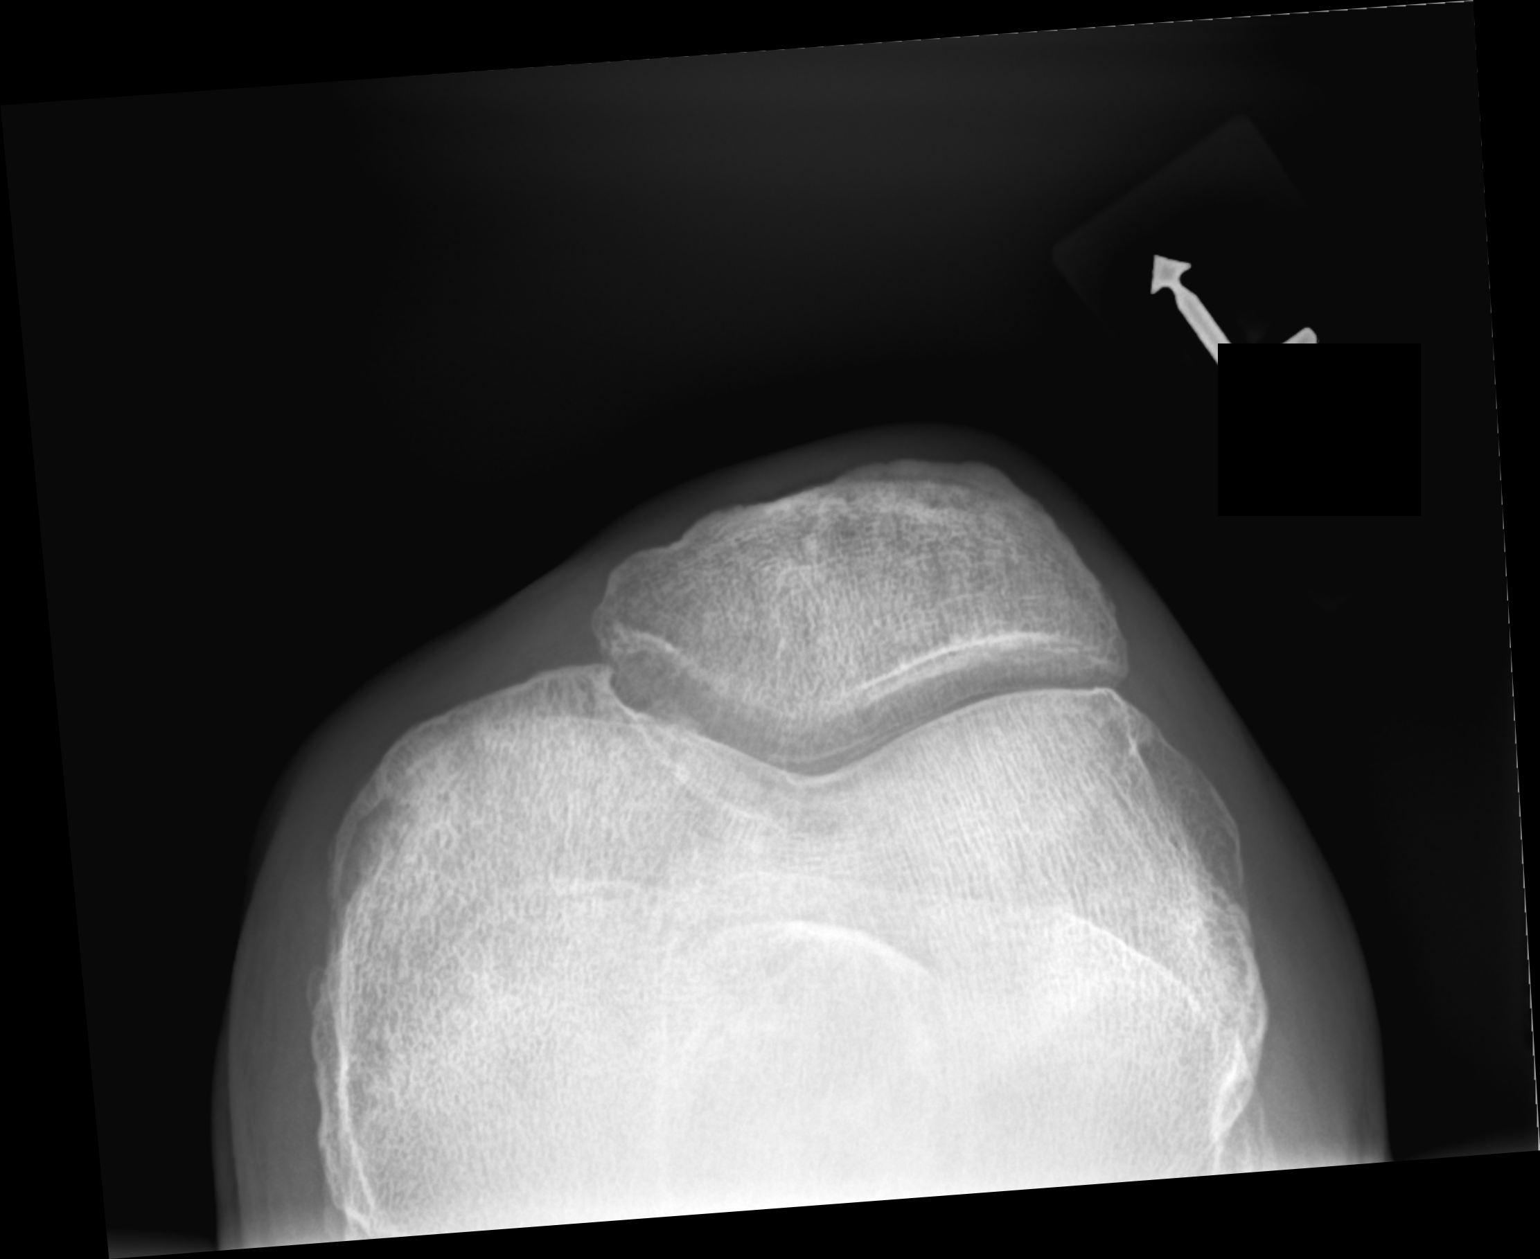

[knee lat]
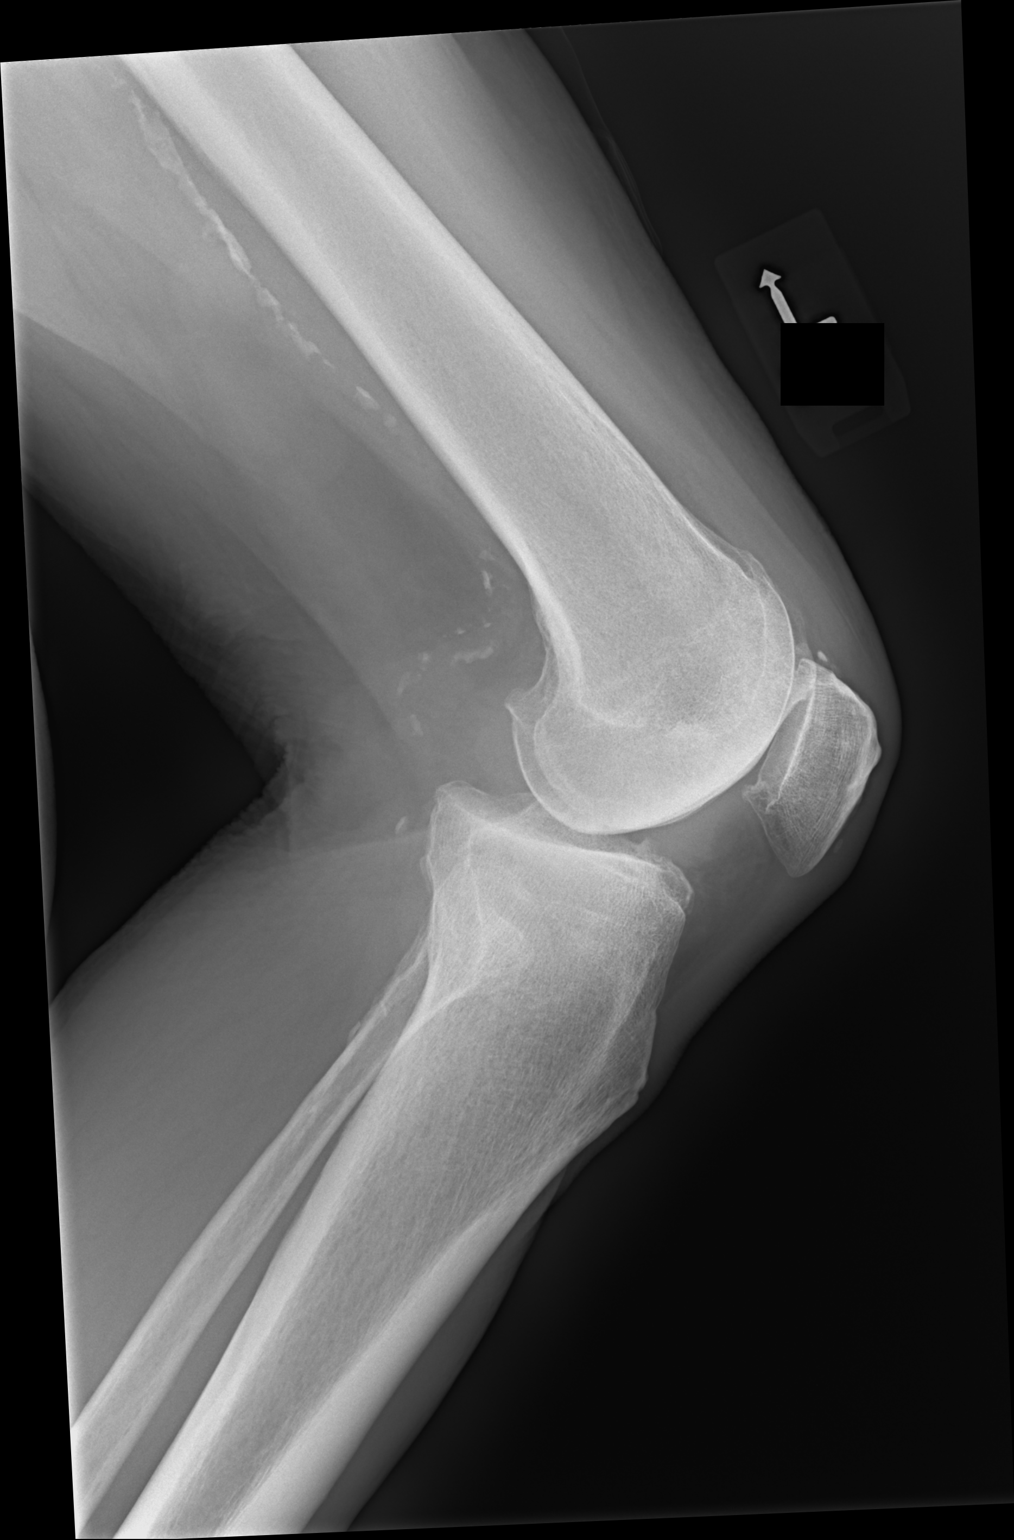

[3 of 3 positions shown; findings below may reference images not displayed]

DIAGNOSTIC STUDIES

EXAM

XR knee LT 3V

INDICATION

left knee pain
Patient states he had surgery on his right leg in September and he wasn't able to put any pressure
on his right side so he was using his left side. Patient states since then he has left knee pain
along with tightness.

TECHNIQUE

AP lateral and patellar views left knee

COMPARISONS

None available

FINDINGS

Severe medial compartment narrowing is noted with varus deformity and marked loss of articular
cartilage of the medial compartment. No fractures are seen. There is minimal spurring of
patellofemoral joint space and lateral compartment. Calcification of the adjacent vasculature is
noted.

IMPRESSION

Marked medial compartment narrowing and varus deformity. Mild degenerative changes of the lateral
compartment and patellofemoral joint space are evident. No fractures are seen.

Tech Notes:

Patient states he had surgery on his right leg in September and he wasn't able to put any pressure
on his right side so he was using his left side. Patient states since then he has left knee pain
along with tightness.

## 2020-08-19 IMAGING — CT BRAIN WO(Adult)
2 of 4 series · 13 of 47 positions shown, 16 images · non-contrast
Comparison: none

[Series 4: brain cor 5.00 hr40 s3 · coronal · 0.30mm/px · 3 of 32 slices shown]
[im 11/32  brain]
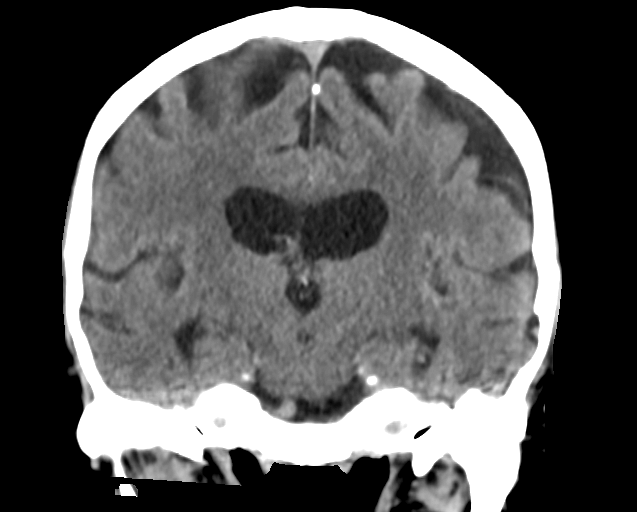
[im 14/32  brain]
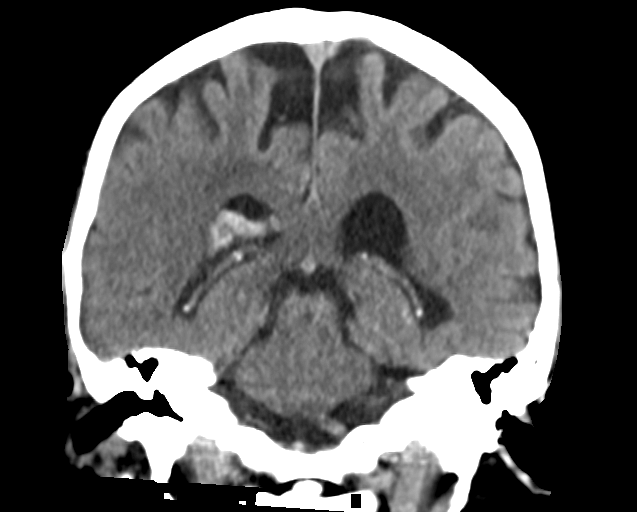
[im 18/32  brain]
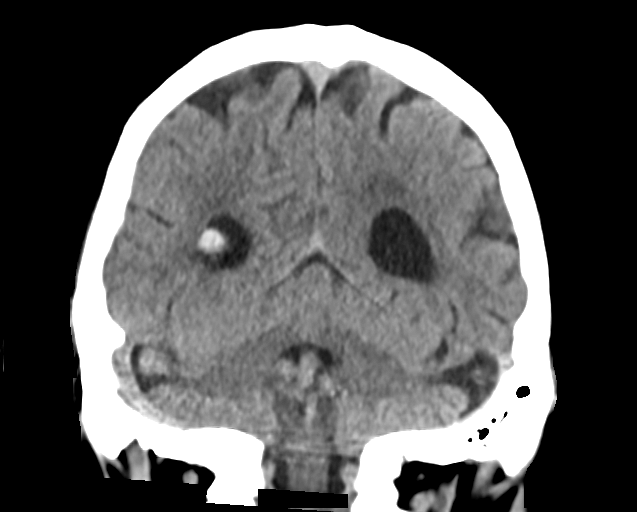

[Series 8: brain ax 2.00 hr60 s3 · axial · 0.37mm/px · z∈[-486,-394]mm · 10 of 25 slices shown, 13 images]
[im 2/25  brain]
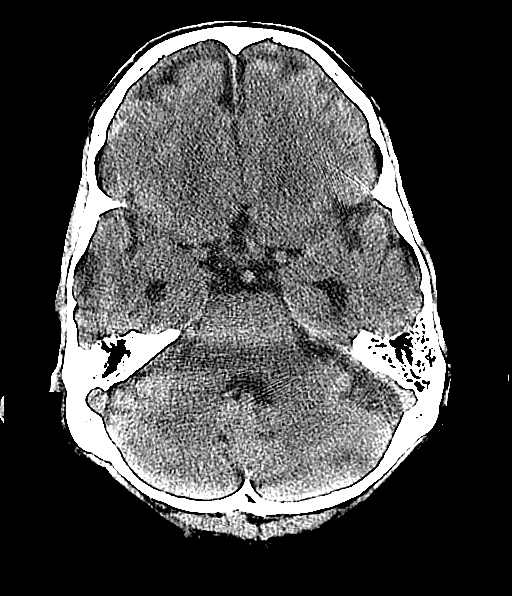
[im 2/25  bone]
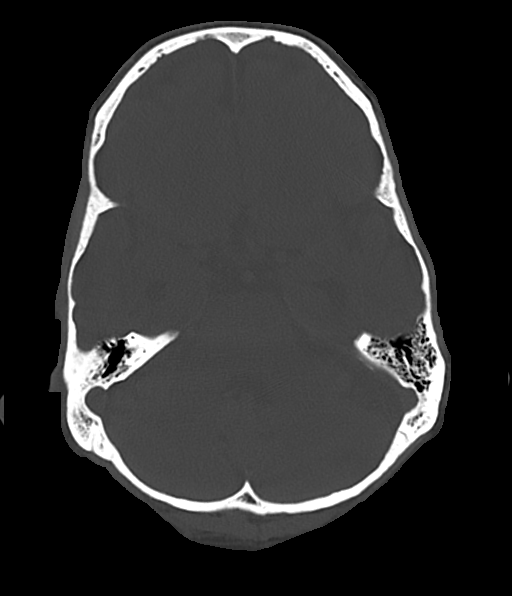
[im 4/25  brain]
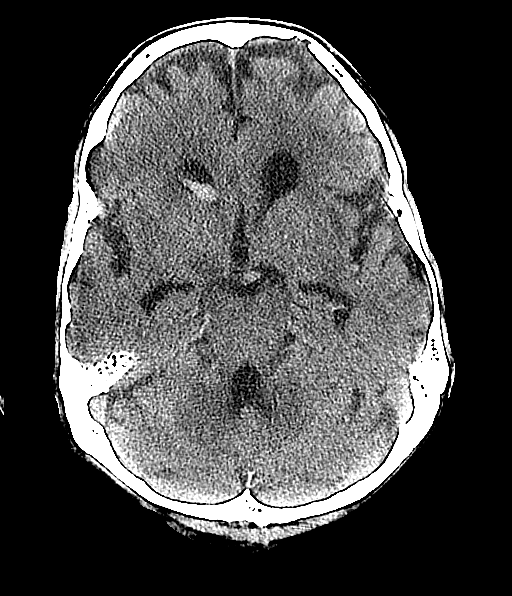
[im 7/25  brain]
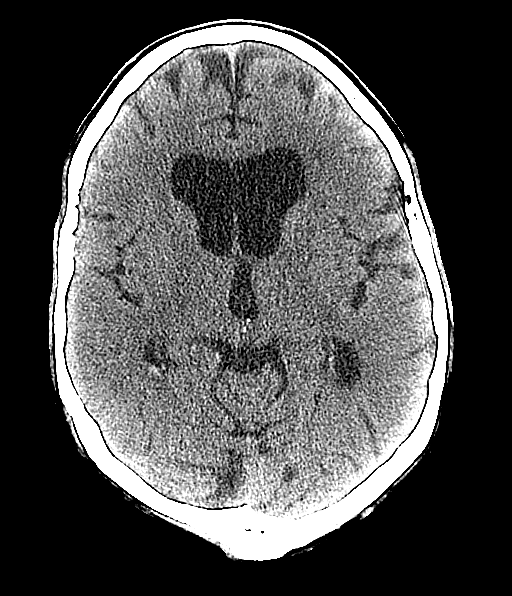
[im 9/25  brain]
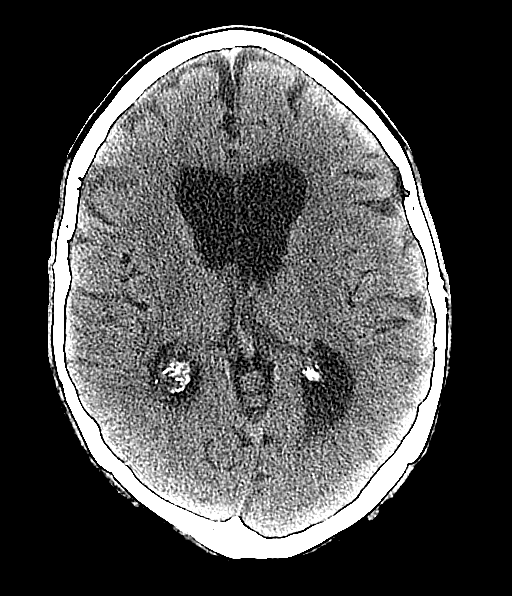
[im 12/25  brain]
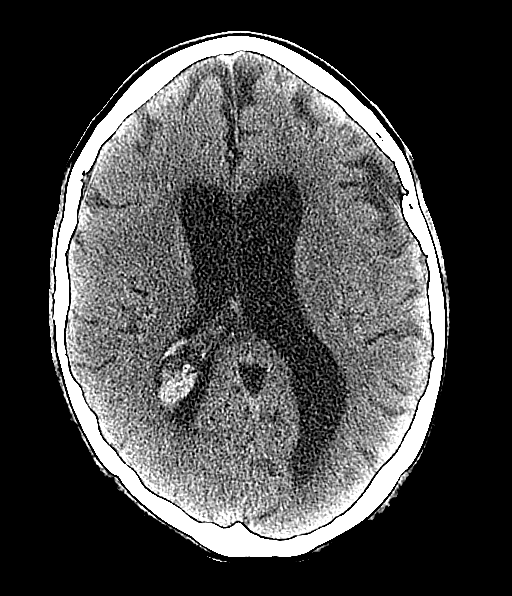
[im 12/25  bone]
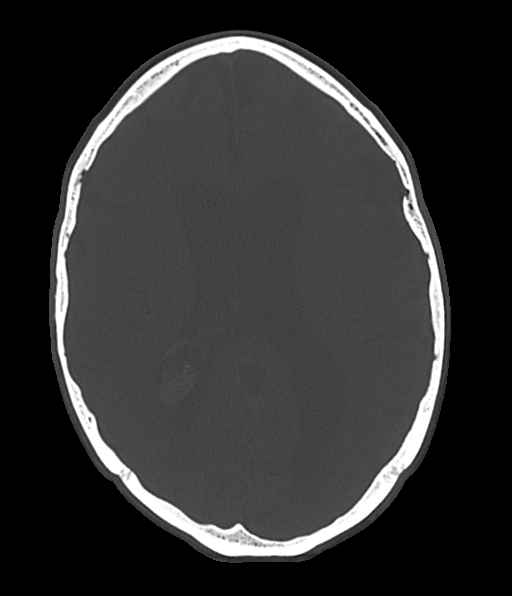
[im 13/25  brain]
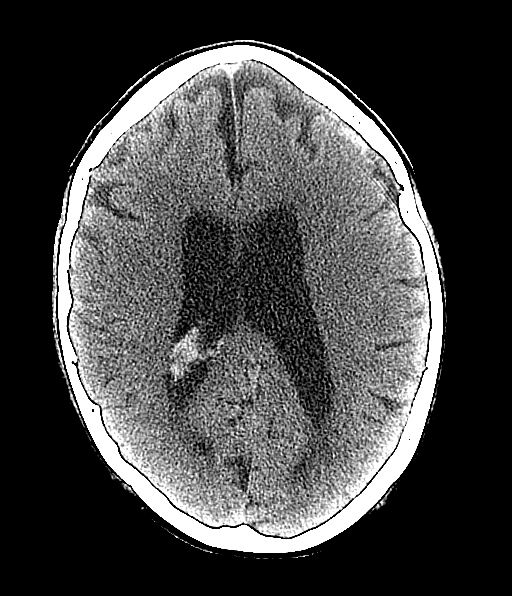
[im 16/25  brain]
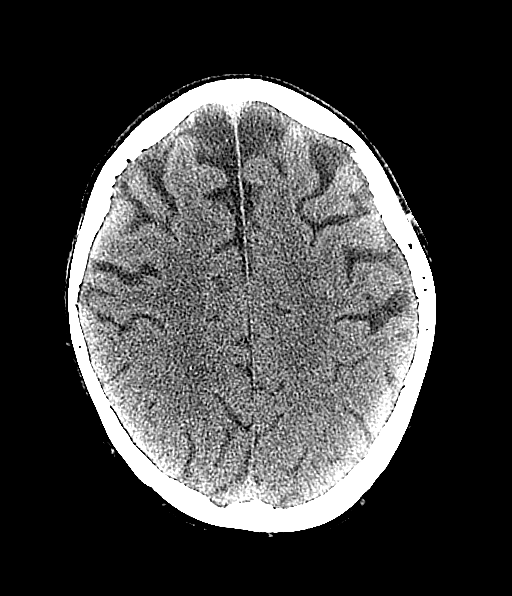
[im 18/25  brain]
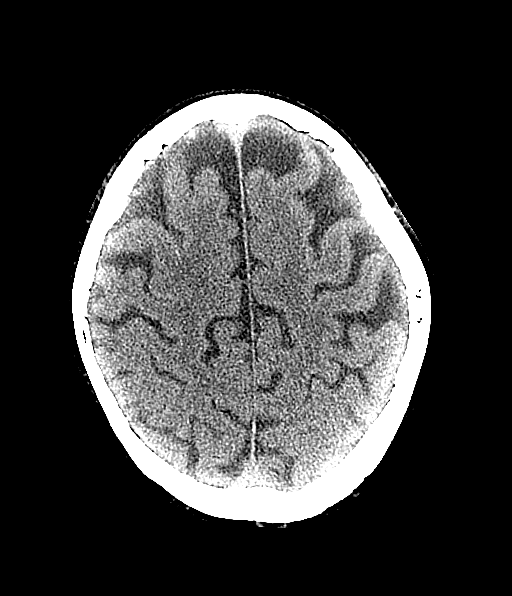
[im 21/25  brain]
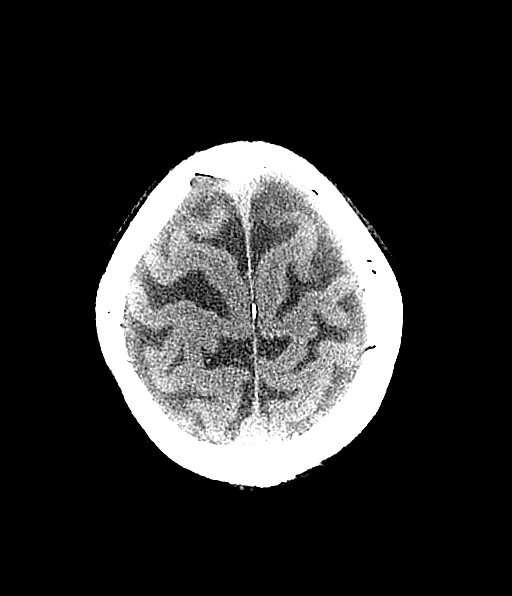
[im 21/25  bone]
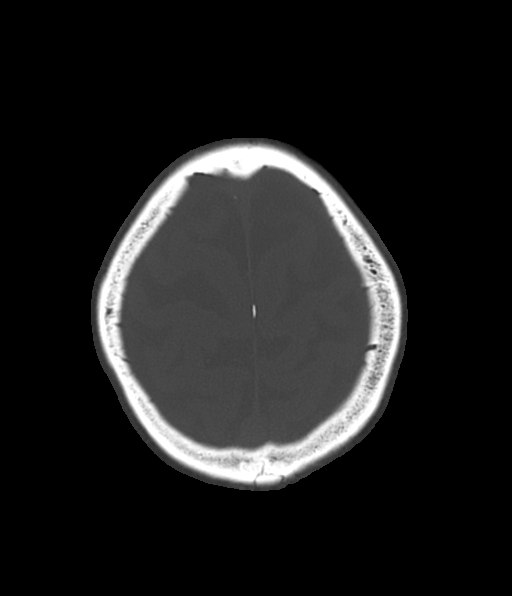
[im 23/25  brain]
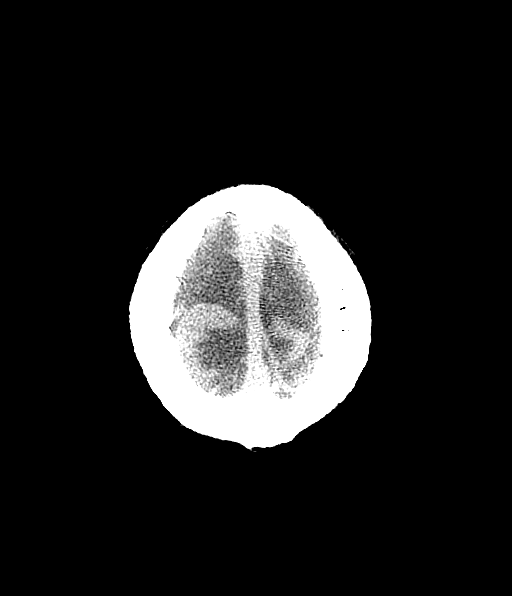

[13 of 47 positions shown; findings below may reference images not displayed]

DIAGNOSTIC STUDIES

EXAM

Noncontrast CT head

INDICATION

confusion
pt c/o confusion that started this AM, no extremity weakness, no trauma or surgery to head, not
confused now, CT/NM 0/0. KF

TECHNIQUE

All CT scans at this facility use dose modulation, iterative reconstruction, and/or weight based
dosing when appropriate to reduce radiation dose to as low as reasonably achievable.

Number of previous computed tomography exams in the last 12 months is 0  .

Number of previous nuclear medicine myocardial perfusion studies in the last 12 months is 0  .

COMPARISONS

None available

FINDINGS

Hyper attenuating 4 x 1 cm mass in the atrium of the right lateral ventricle. 1.5 x 0.5 cm area of
hyperattenuation within the frontal horn of the right lateral ventricle.

No vascular territory area of gray-white loss. No hydrocephalus. The brain parenchyma is normal in
attenuation. The paranasal sinuses and mastoid air cells are clear.

IMPRESSION

Hyperattenuating material within the atrium of the right lateral ventricle and within the frontal
horn of the right lateral ventricle. Differential considerations include intra ventricular blood
products or choroid plexus xanthogranulomas. Recommend further evaluation with MRI of the head with
contrast or short-term follow-up head CT.

No hydrocephalus.

Tech Notes:

pt c/o confusion that started this AM, no extremity weakness, no trauma or surgery to head, not
confused now, CT/NM 0/0. KF

## 2020-08-19 IMAGING — CT BRAIN WO(Adult)
3 of 4 series · 14 of 47 positions shown, 16 images · non-contrast
Comparison: none

[Series 4: brain cor 5.00 hr40 s3 · coronal · 0.30mm/px · 3 of 22 slices shown]
[im 8/22  brain]
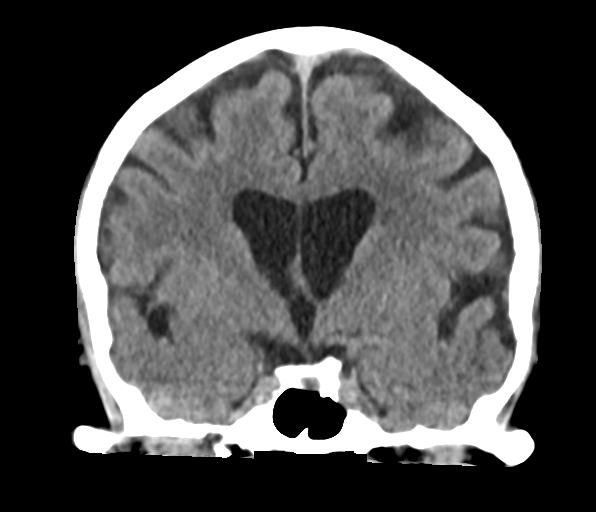
[im 10/22  brain]
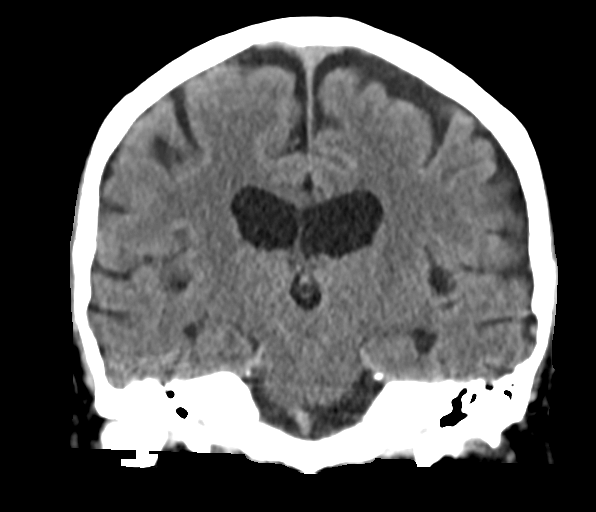
[im 12/22  brain]
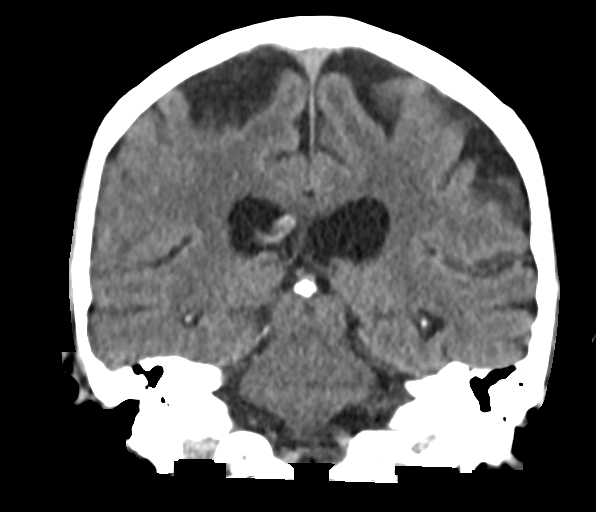

[Series 6: brain sag 5.00 hr40 s3 · sagittal · 0.30mm/px · 3 of 25 slices shown]
[im 9/25  brain]
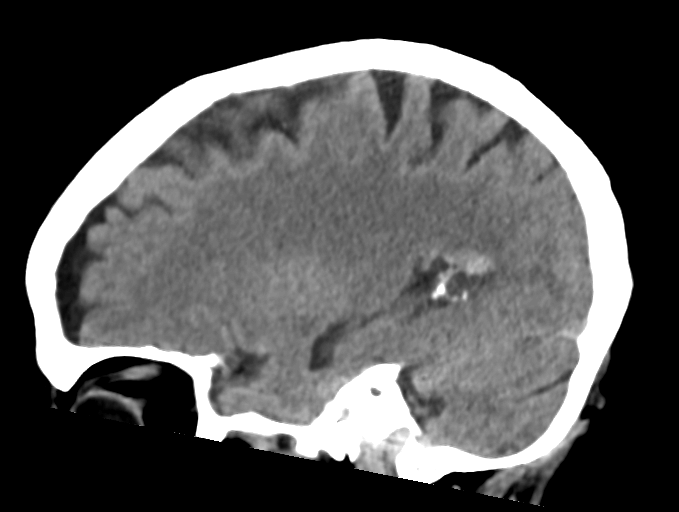
[im 13/25  brain]
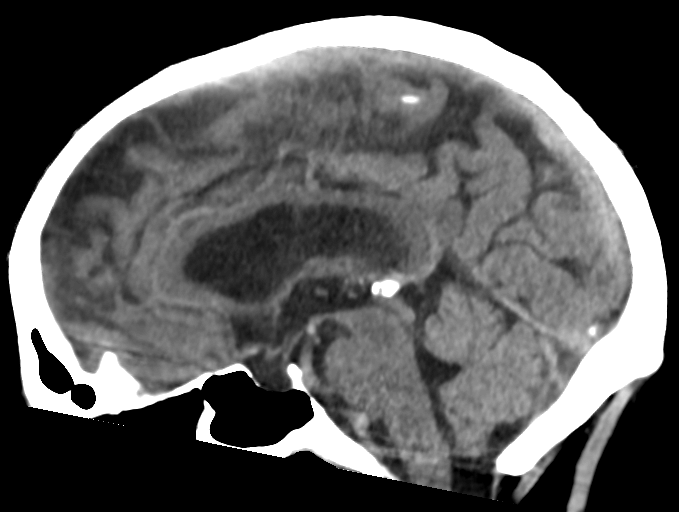
[im 17/25  brain]
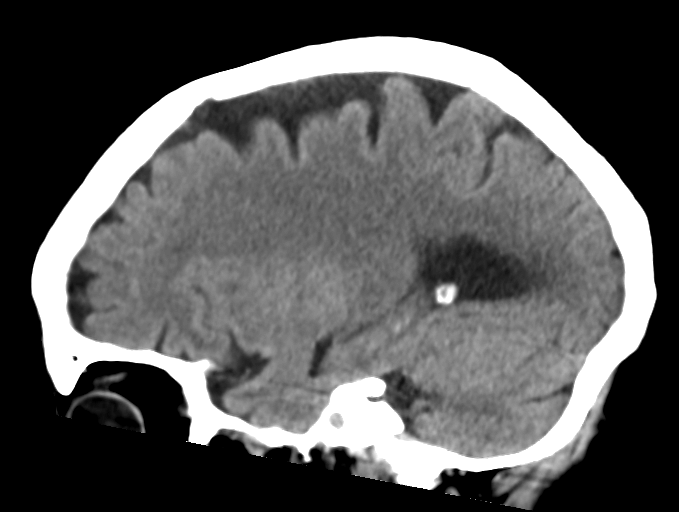

[Series 8: brain ax 2.00 hr60 s3 · axial · 0.35mm/px · z∈[-528,-430]mm · 8 of 33 slices shown, 10 images]
[im 3/33  brain]
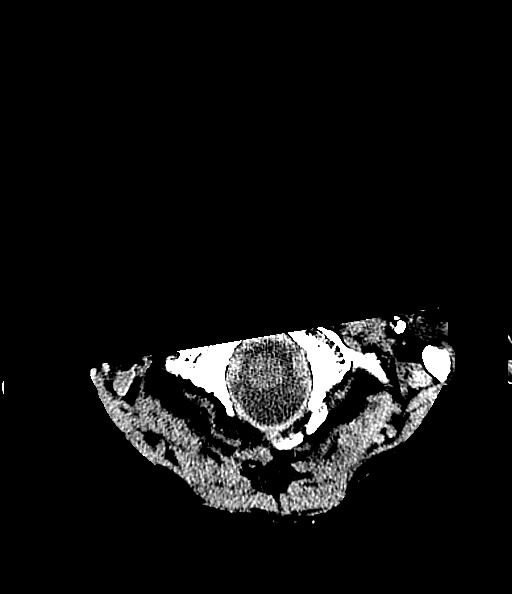
[im 3/33  bone]
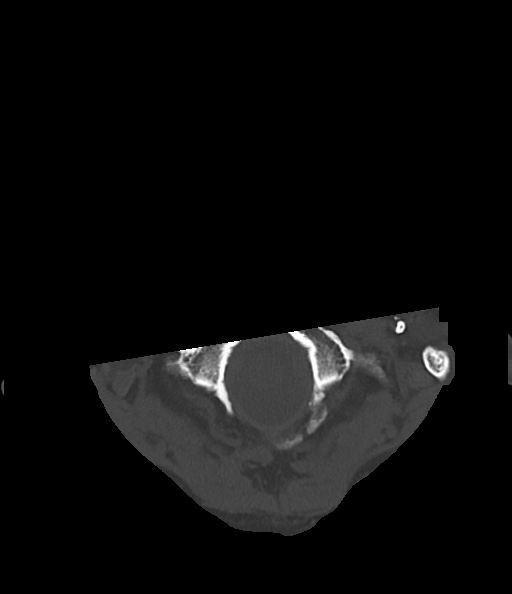
[im 7/33  brain]
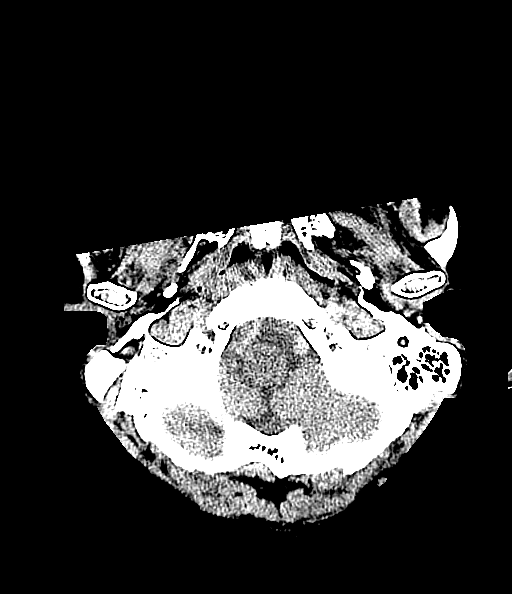
[im 10/33  brain]
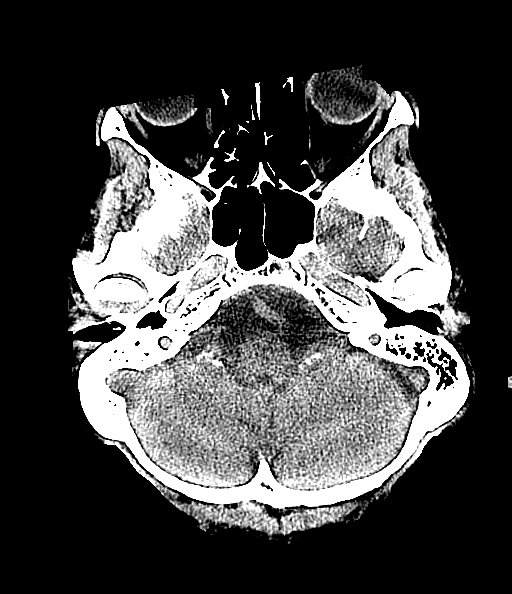
[im 14/33  brain]
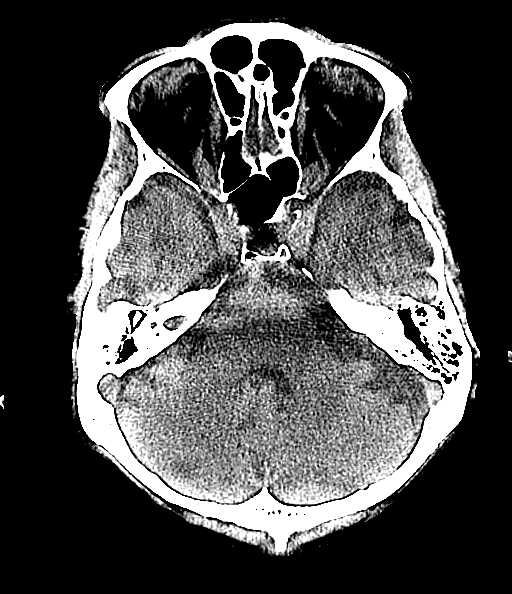
[im 19/33  brain]
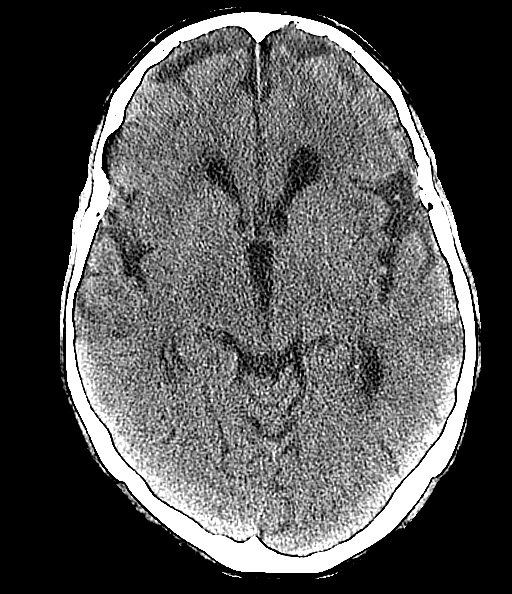
[im 19/33  bone]
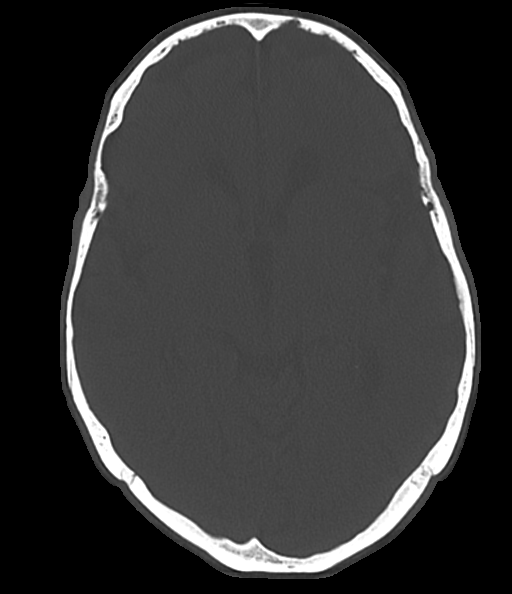
[im 23/33  brain]
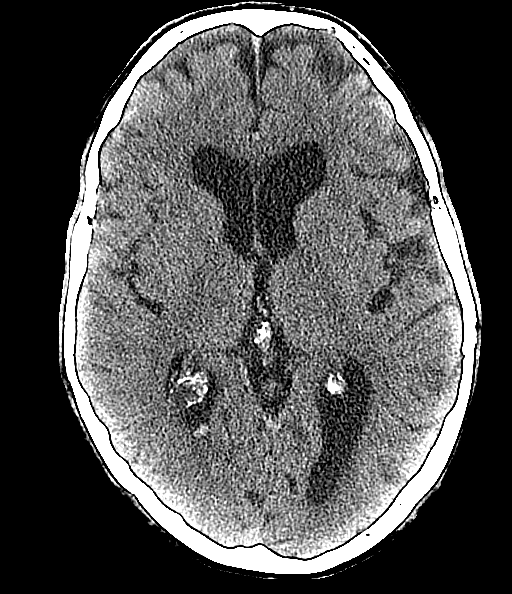
[im 26/33  brain]
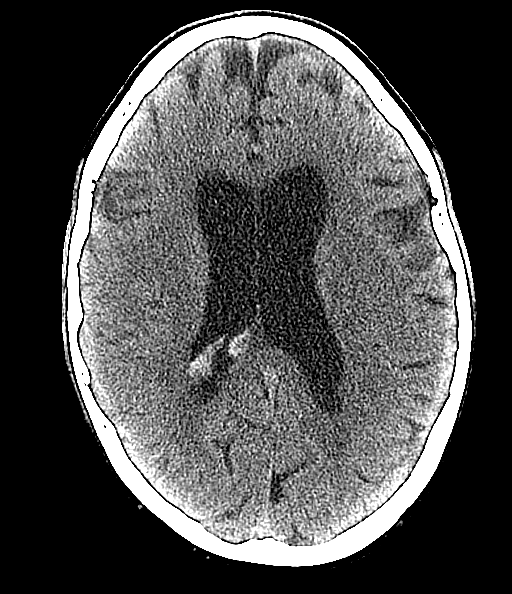
[im 30/33  brain]
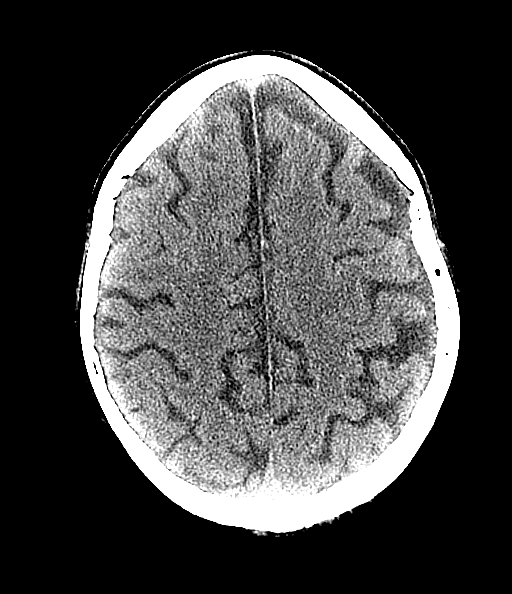

[14 of 47 positions shown; findings below may reference images not displayed]

DIAGNOSTIC STUDIES

EXAM

INDICATION

abnormal CT
F/U CT FROM PREV DONE THIS AM 08/19/20 PER RADIOLOGIST. PT STATES IMPROVEMENT OF SYMPTOMS.AK
CT/NM:1/0

TECHNIQUE

All CT scans at this facility use dose modulation, iterative reconstruction, and/or weight based
dosing when appropriate to reduce radiation dose to as low as reasonably achievable.

Number of previous computed tomography exams in the last 12 months is 0  .

Number of previous nuclear medicine myocardial perfusion studies in the last 12 months is 0  .

COMPARISONS

Earlier in the same day

FINDINGS

Persistent hyper attenuating material/mass in the right lateral ventricle. Stable ventricular
sign. The basilar cisterns are patent. Normal gray-white matter differentiation. The paranasal
sinuses and mastoid air cells are clear

IMPRESSION

Stable hyper attenuating material within the right lateral ventricle

No significant change compared with the examination earlier in the same day

Tech Notes:

F/U CT FROM PREV DONE THIS AM 08/19/20 PER RADIOLOGIST. PT STATES IMPROVEMENT OF SYMPTOMS.AK
CT/NM:1/0

## 2020-08-20 ENCOUNTER — Encounter: Admit: 2020-08-20 | Discharge: 2020-08-20

## 2020-08-20 NOTE — Progress Notes
80yo male was admitted yesterday for mild confusion after falling and hitting his head, CT Head shows a hyper-attenuation in one of the ventricles, f/u CT shows no changes. Pt is A&O x3

## 2020-09-01 IMAGING — CT BRAIN WO(Adult)
3 of 4 series · 14 of 47 positions shown, 16 images · non-contrast
Comparison: none

[Series 4: brain cor 5.00 hr40 s3 · coronal · 0.31mm/px · 3 of 47 slices shown]
[im 16/47  brain]
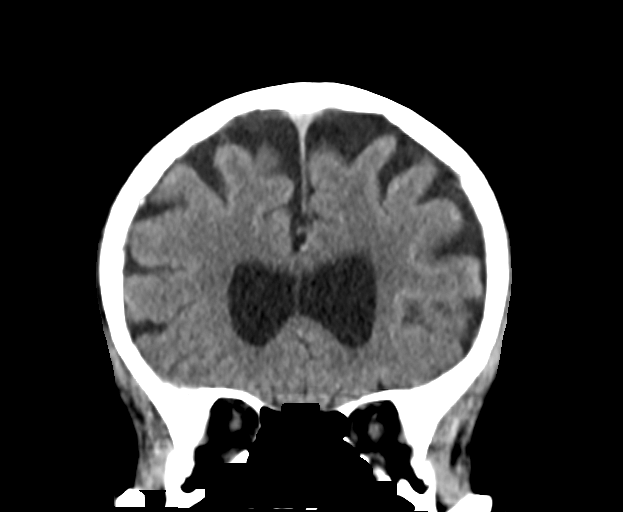
[im 21/47  brain]
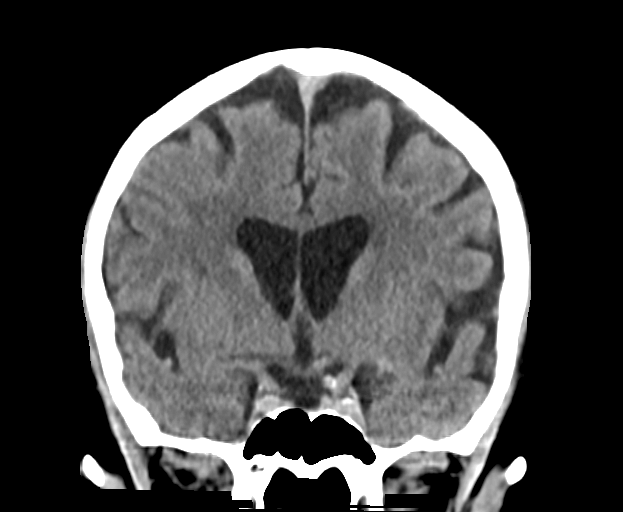
[im 26/47  brain]
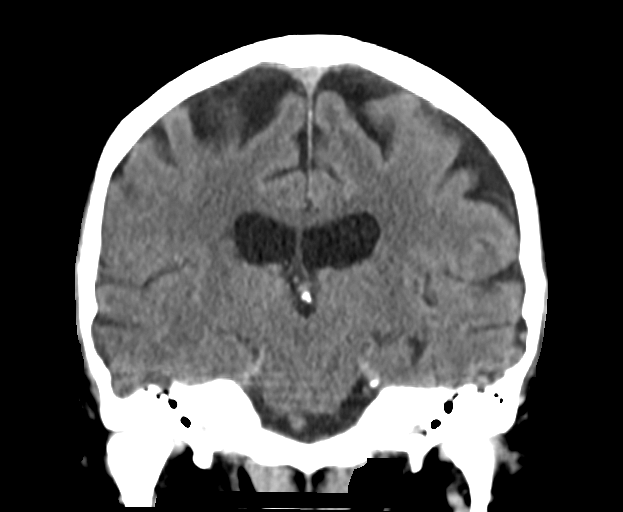

[Series 6: brain sag 5.00 hr40 s3 · sagittal · 0.31mm/px · 3 of 34 slices shown]
[im 12/34  brain]
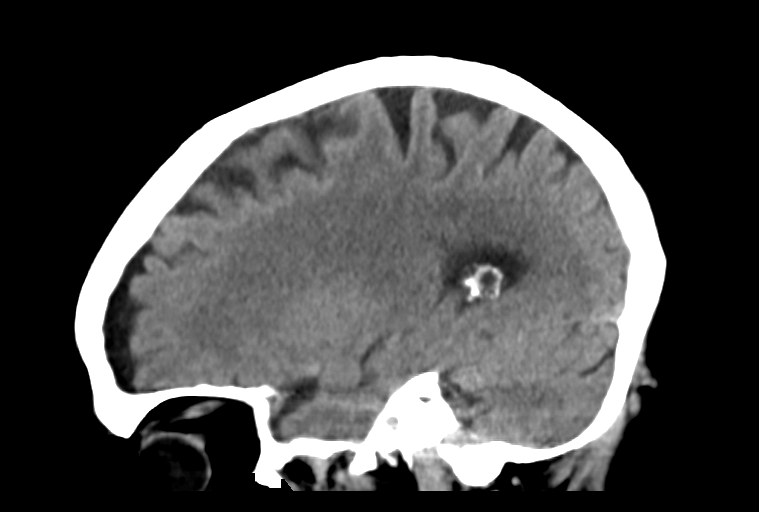
[im 17/34  brain]
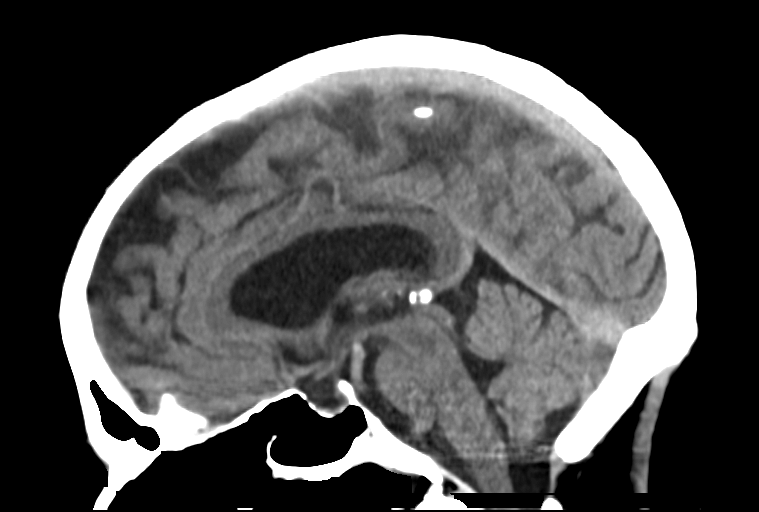
[im 23/34  brain]
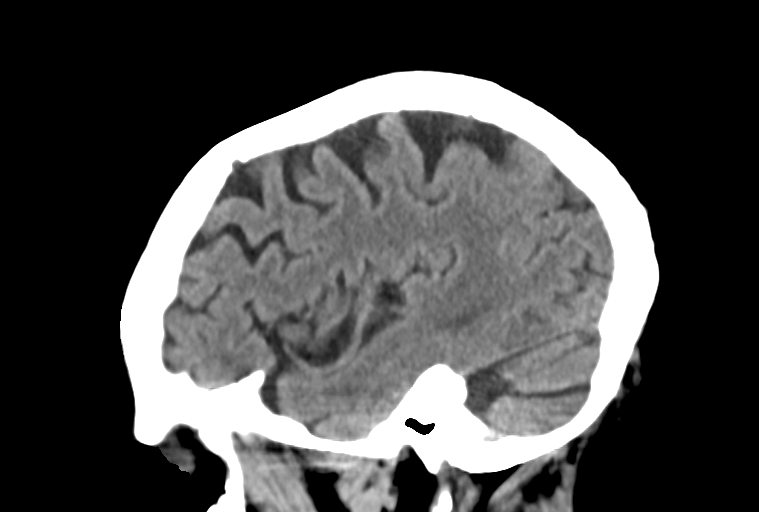

[Series 8: brain ax 2.00 hr60 s3 · axial · 0.38mm/px · z∈[-543,-429]mm · 8 of 72 slices shown, 10 images]
[im 8/72  brain]
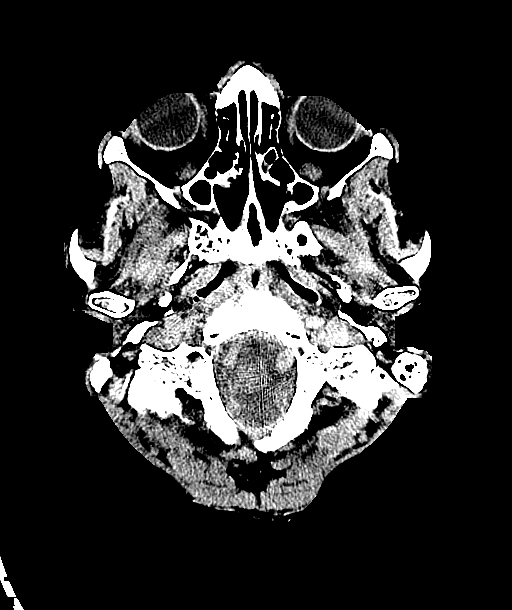
[im 8/72  bone]
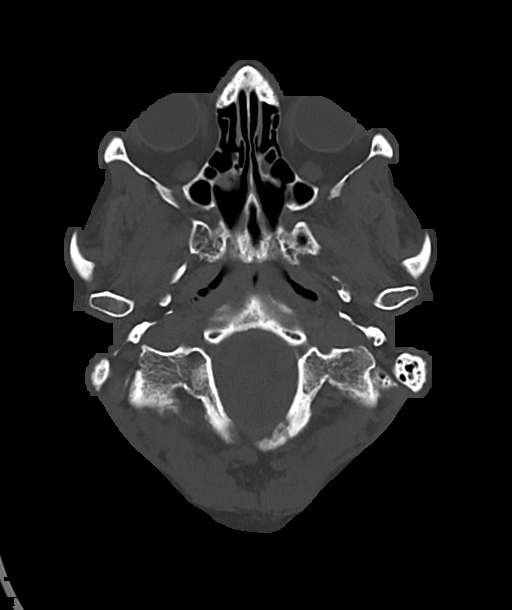
[im 15/72  brain]
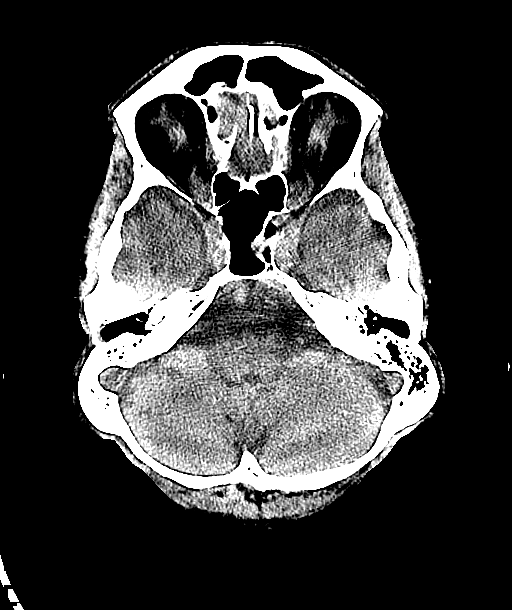
[im 22/72  brain]
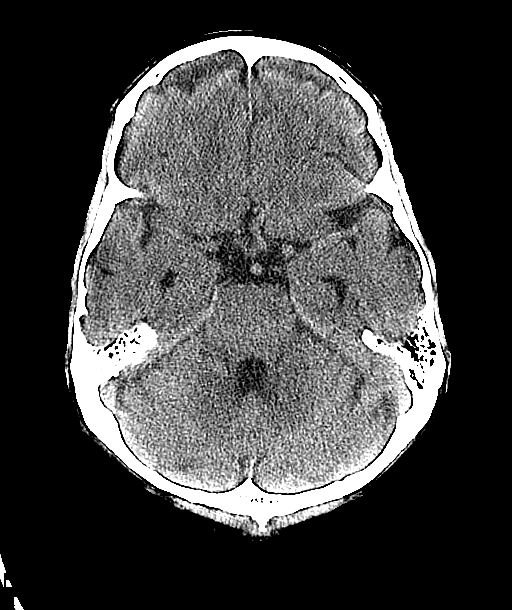
[im 32/72  brain]
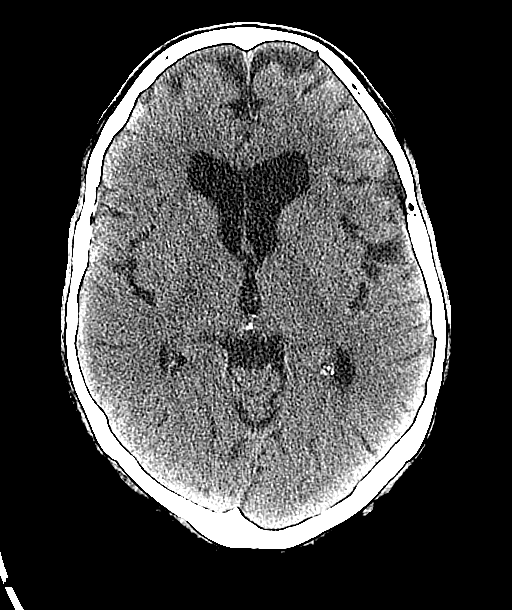
[im 40/72  brain]
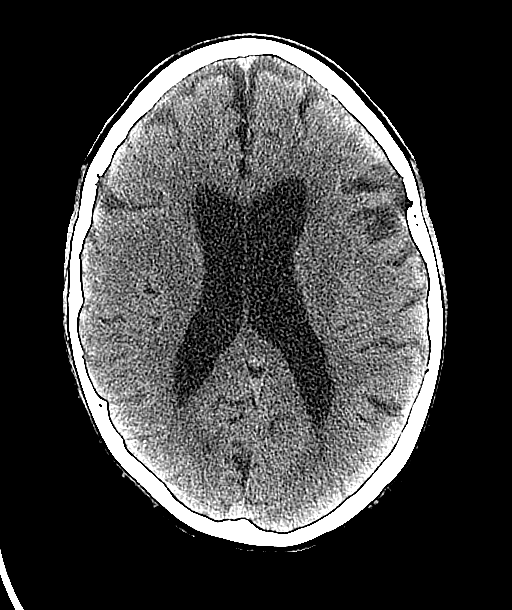
[im 40/72  bone]
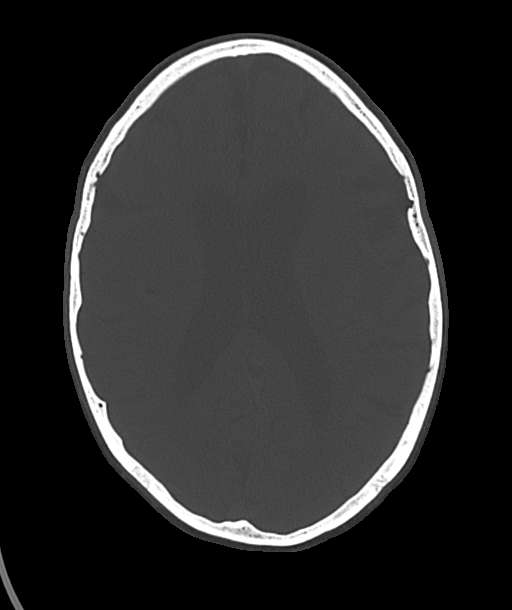
[im 50/72  brain]
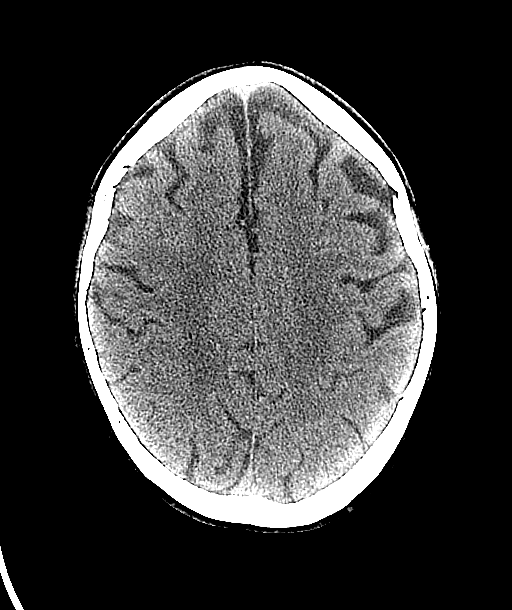
[im 57/72  brain]
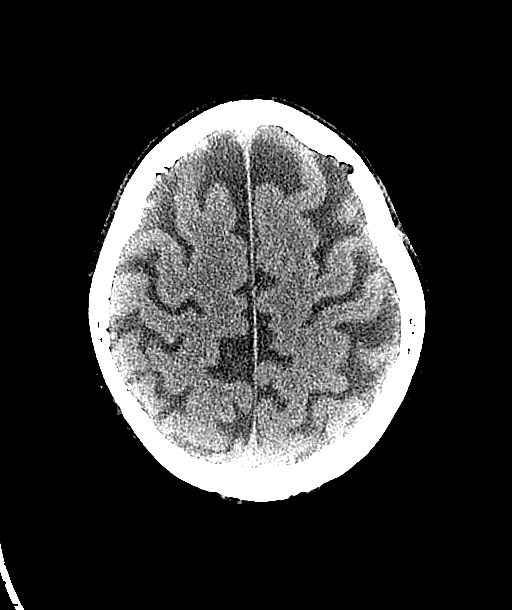
[im 64/72  brain]
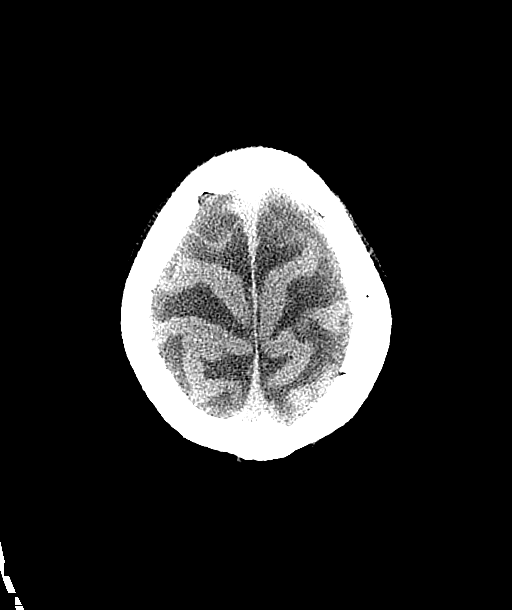

[14 of 47 positions shown; findings below may reference images not displayed]

EXAM

CT HEAD

INDICATION

abnormal ct of head
PRIOR ABN IMAGING OF HEAD 08/19/20- FELL AND HIT HEAD WHILE WALKING DOG, NO NEW TRAUMA, NO HA. CT/NM
2/0. KF

TECHNIQUE

All CT scans at this facility use dose modulation, iterative reconstruction, and/or weight based
dosing when appropriate to reduce radiation dose to as low as reasonably achievable.

CT of the head without contrast was performed.

# of CT scans in the past year: 2

# of Myocardial perfusion scans this past year: 0

COMPARISONS

08/29/2020

FINDINGS

Parenchyma: No acute hemorrhage.  There is no mass effect, midline shift, or herniation. There is
preservation of the gray white differentiation.

Ventricles / Extra-axial spaces: Mild global volume loss with associated prominence of the
ventricles

of the paranasal sinuses and mastoid air cells are clear.

IMPRESSION
1. No CT evidence of an acute intracranial abnormality.

Tech Notes:

PRIOR ABN IMAGING OF HEAD 08/19/20- FELL AND HIT HEAD WHILE WALKING DOG, NO NEW TRAUMA, NO HA. CT/NM
2/0. KF

## 2021-11-27 IMAGING — MR BRAINWO
9 of 13 series · 28 of 48 positions shown · non-contrast
Comparison: none

[Series 9: T1 · sagittal · 5.0mm · 0.80mm/px · 3 of 27 slices shown (1 of 2)]
[im 1/27]
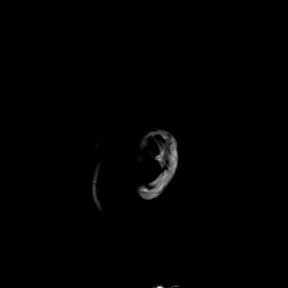
[im 14/27]
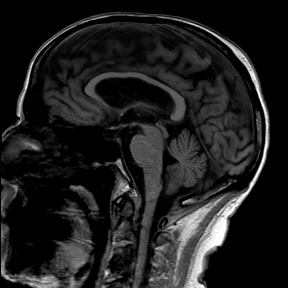
[im 27/27]
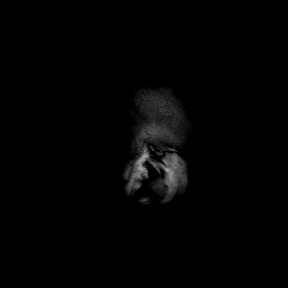

[Series 10: DWI · axial · 5.0mm · 1.44mm/px · z∈[-104,+52]mm · 3 of 27 slices shown (1 of 3)]
[im 1/27]
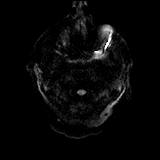
[im 14/27]
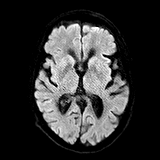
[im 27/27]
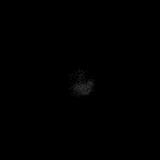

[Series 10: DWI · axial · 5.0mm · 1.44mm/px · z∈[-104,+52]mm · 3 of 27 slices shown (2 of 3)]
[im 1/27]
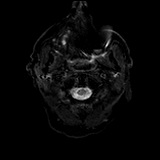
[im 14/27]
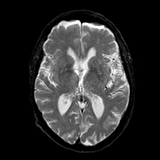
[im 27/27]
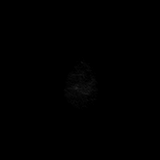

[Series 11: DWI · axial · 5.0mm · 1.44mm/px · z∈[-104,+52]mm · 3 of 27 slices shown (3 of 3)]
[im 1/27]
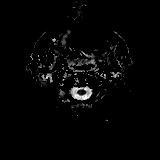
[im 14/27]
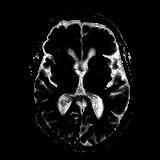
[im 27/27]
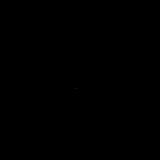

[Series 12: FLAIR · axial · 5.0mm · 0.72mm/px · z∈[-103,+53]mm · 3 of 27 slices shown]
[im 1/27]
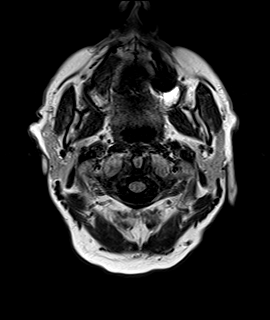
[im 14/27]
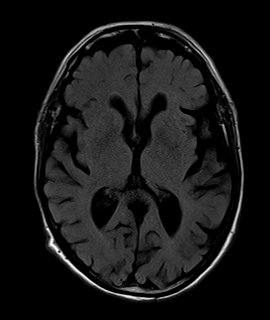
[im 27/27]
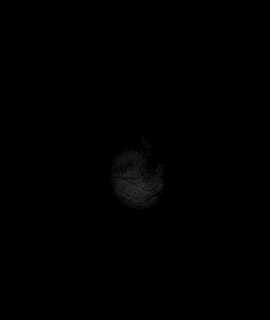

[Series 13: T2 · axial · 5.0mm · 0.45mm/px · z∈[-103,+53]mm · 3 of 27 slices shown (1 of 2)]
[im 1/27]
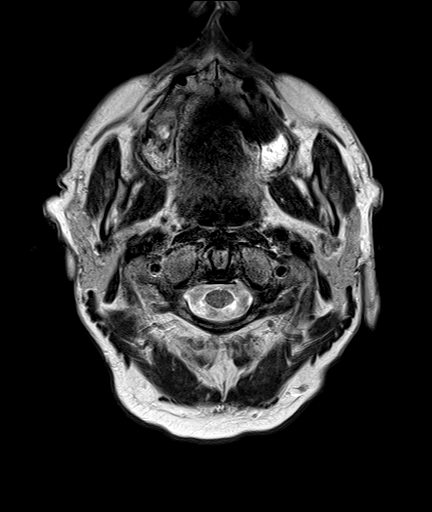
[im 14/27]
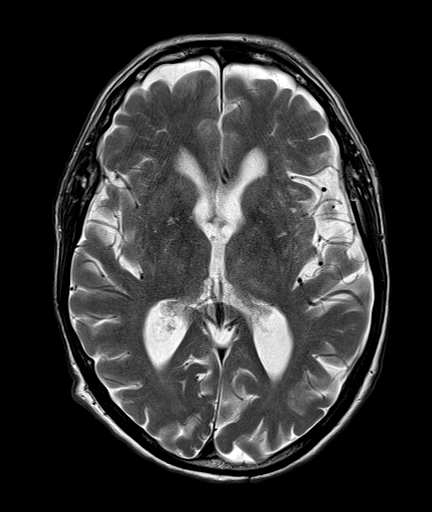
[im 27/27]
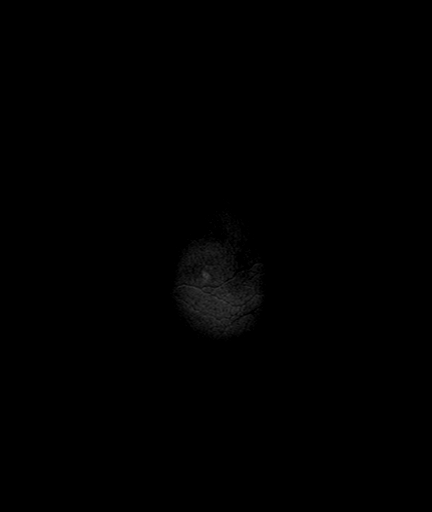

[Series 14: T1 · axial · 5.0mm · 0.72mm/px · z∈[-103,+53]mm · 3 of 27 slices shown (2 of 2)]
[im 1/27]
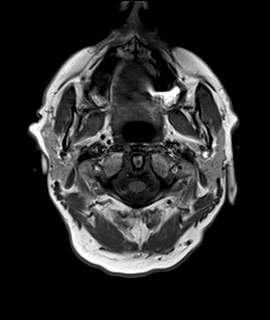
[im 14/27]
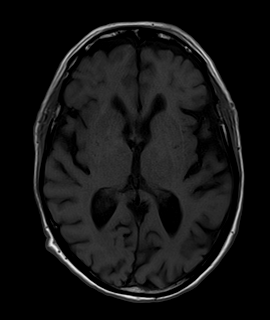
[im 27/27]
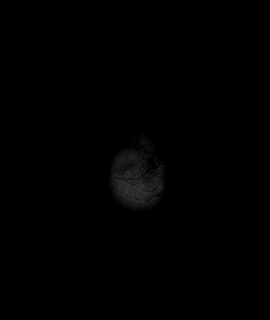

[Series 15: GRE · axial · 5.0mm · 0.45mm/px · z∈[-103,+53]mm · 3 of 27 slices shown]
[im 1/27]
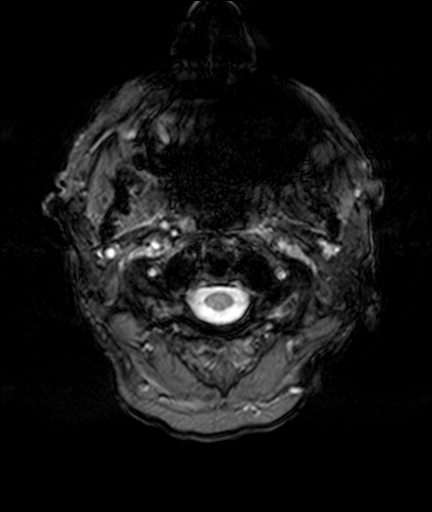
[im 14/27]
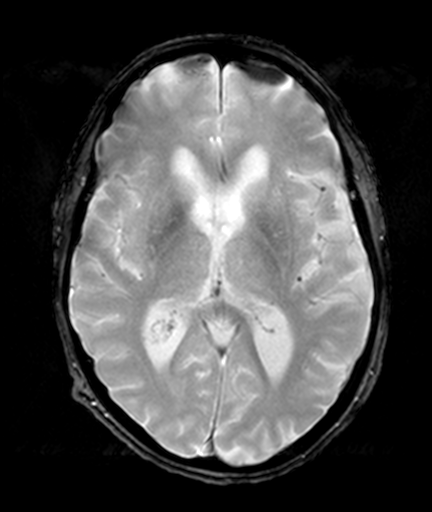
[im 27/27]
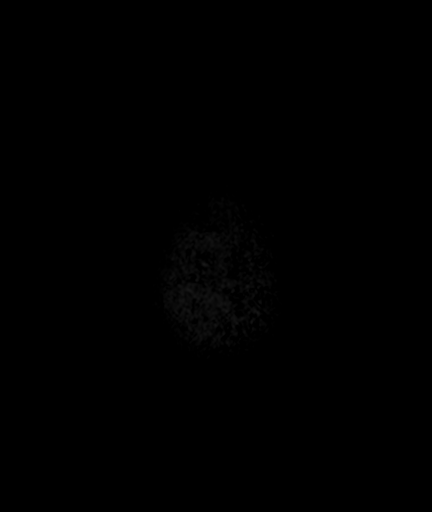

[Series 16: T2 · coronal · 4.0mm · 0.72mm/px · 4 of 35 slices shown (2 of 2)]
[im 1/35]
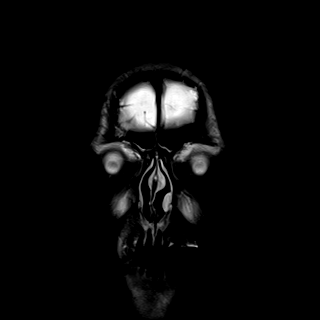
[im 12/35]
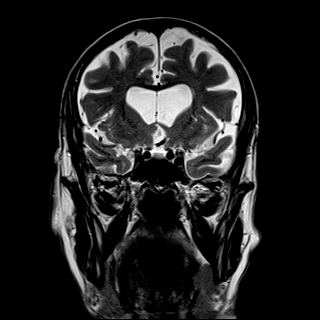
[im 23/35]
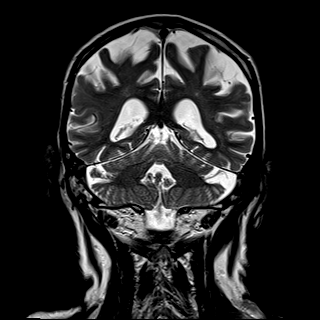
[im 35/35]
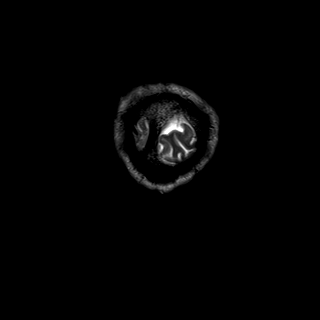

[28 of 48 positions shown; findings below may reference images not displayed]

DIAGNOSTIC STUDIES

EXAM

MRI of the brain with and without contrast, MR angiography of the head and neck.

INDICATION

concussion
PT GOT DIZZY, PASSED OUT, HIT HEAD, REQUIRING STITCHES 2 WEEKS AGO.  CONTINUED DIZZINESS.  15 ML
[facility] RG

TECHNIQUE

Sagittal axial coronal images were obtained with variable T1 and T2 weighting before after
administration of gadolinium. This includes 3D  time-of-flight imaging.

COMPARISONS

None available

FINDINGS

MR head:
There are no abnormal signal properties of the brainstem or visualized cervical cord. The expected
signal voids are noted throughout the major intracranial vascular structures. There is mucosal
thickening of the paranasal sinuses particularly the maxillary sinuses with subtotal opacification
of the right maxillary sinus. Minimal fluid is noted within the mastoid air cells bilaterally.

There is diffuse cortical atrophy with minimal small vessel ischemic changes. No acute ischemia is
seen on diffusion-weighted imaging.

There is no intracranial mass or hemorrhage.

MR angiography of the brain: Normal flow is noted throughout the vertebral basilar system and
cavernous carotid arteries. No aneurysm or vascular malformations are seen. No filling defects or
vessel cutoff is seen throughout the visualized intracranial vasculature.

MR angiography of the neck:
Three-vessel aortic arch is noted. Normal flow is noted throughout the vertebral arteries. There is
no evidence for significant stenosis throughout either carotid artery.

IMPRESSION

Cortical atrophy and proportional enlargement of the ventricular system. There is minimal small
vessel ischemic disease with no intracranial mass or hemorrhage.

No evidence for stenosis throughout the cervical carotid arteries.

The visualized intracranial arterial anatomy is within normal limits.

Inflammatory changes of the paranasal sinuses and mastoid air cells. Correlation with clinical
symptoms is recommended.

Tech Notes:

PT GOT DIZZY, PASSED OUT, HIT HEAD, REQUIRING STITCHES 2 WEEKS AGO.  CONTINUED DIZZINESS.  RG

## 2021-11-27 IMAGING — MR AGNECKWW
4 of 7 series · 12 of 48 positions shown · non-contrast
Comparison: none

[Series 9: cor slab pre · coronal · non-contrast · 0.7mm · 0.50mm/px · 5 of 112 slices shown]
[im 1/112]
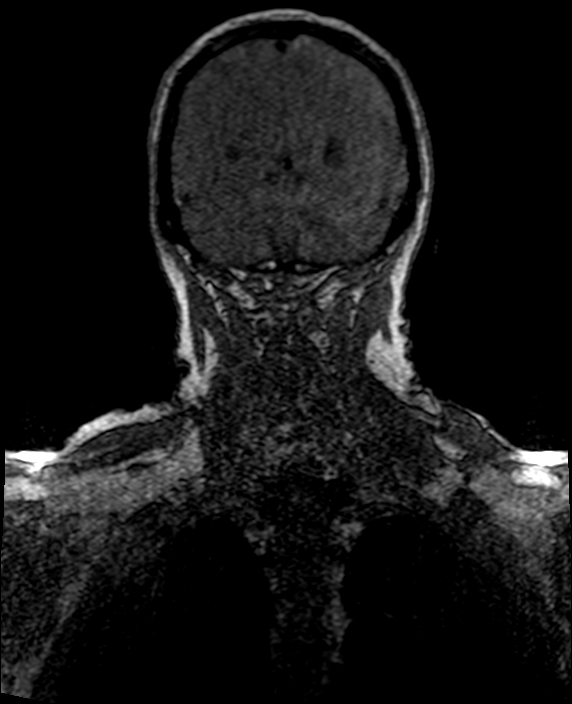
[im 13/112]
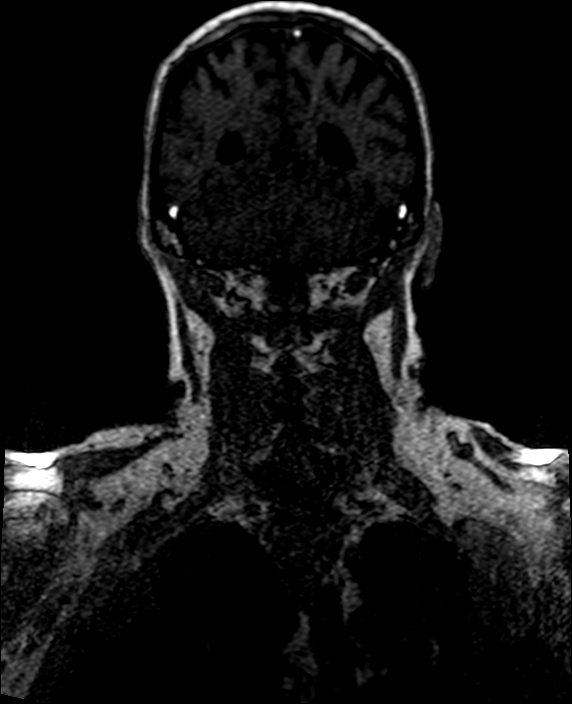
[im 38/112]
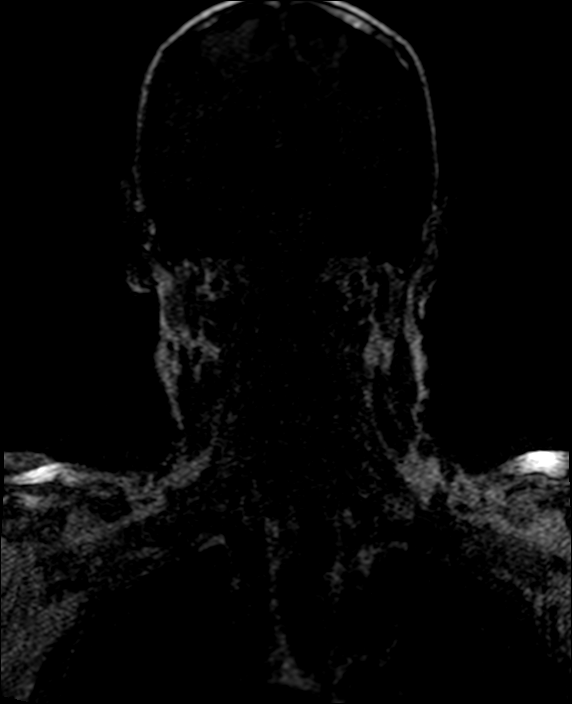
[im 62/112]
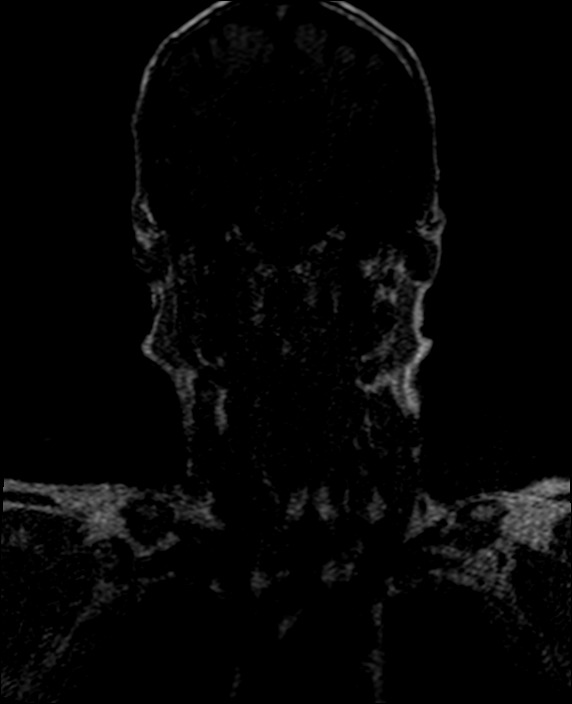
[im 99/112]
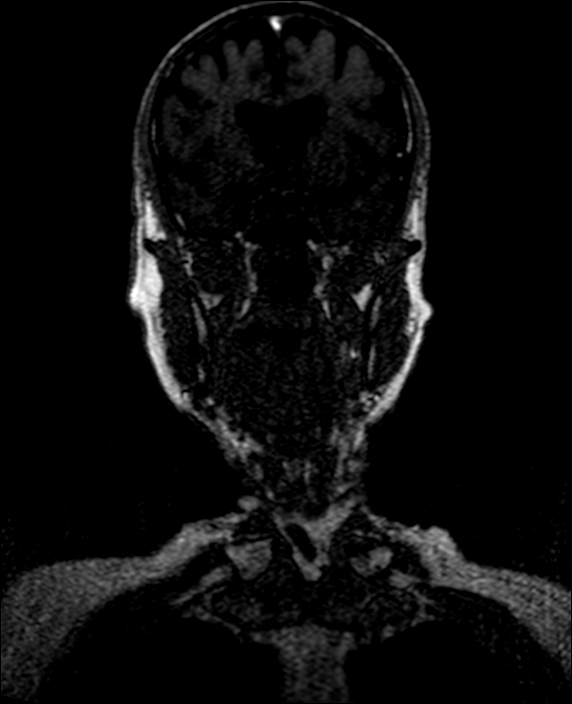

[Series 14: cor slab post · coronal · 0.7mm · 0.50mm/px · 3 of 112 slices shown]
[im 13/112]
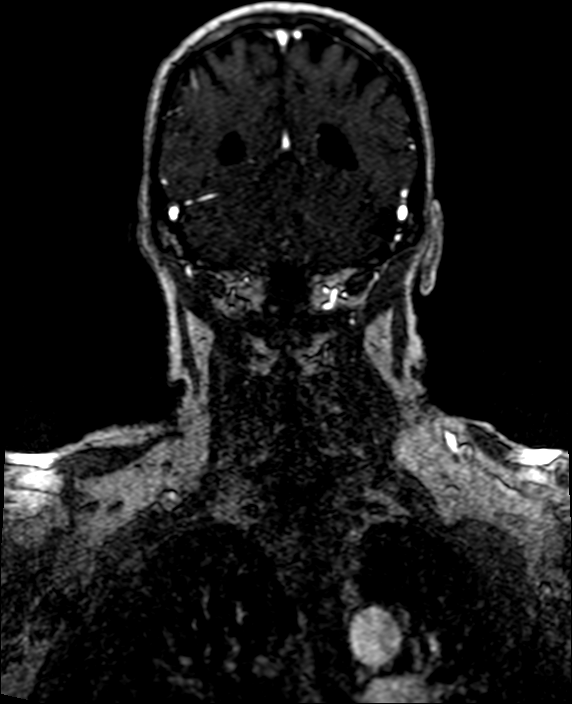
[im 62/112]
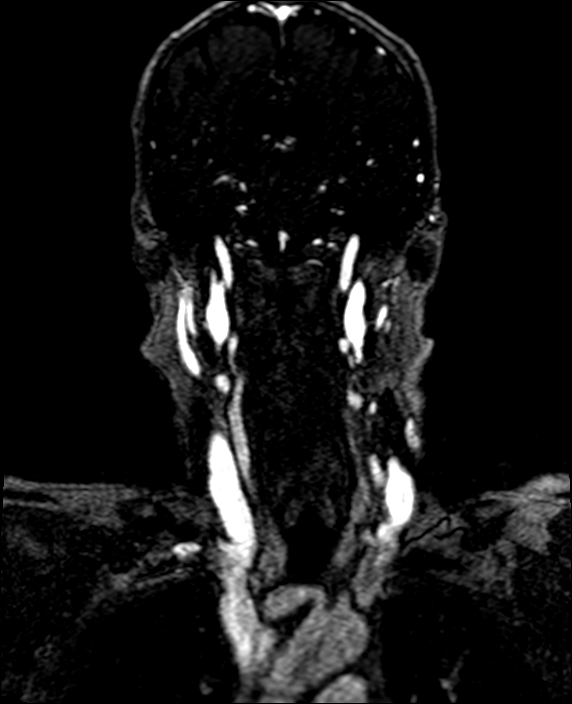
[im 99/112]
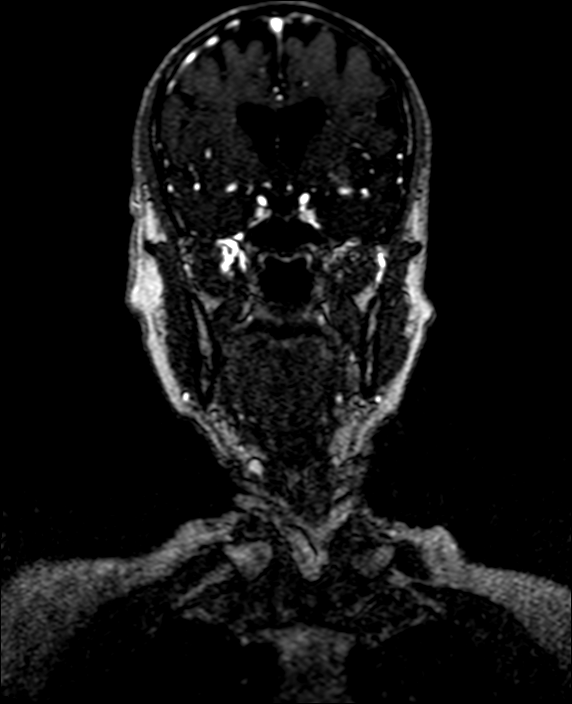

[Series 15: cor slab post_sub · coronal · 0.7mm · 0.50mm/px · 3 of 112 slices shown]
[im 13/112]
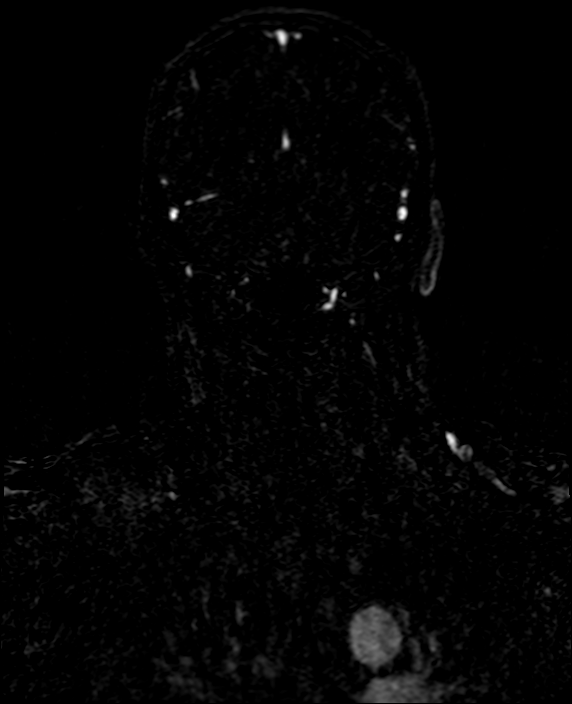
[im 62/112]
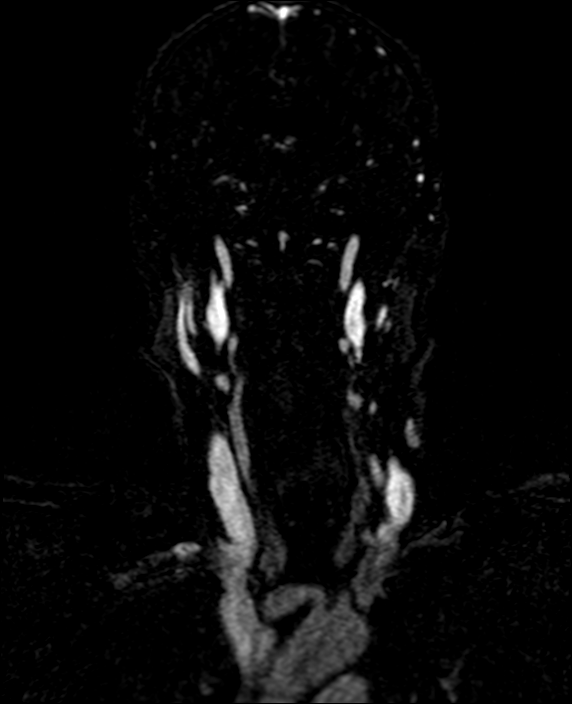
[im 99/112]
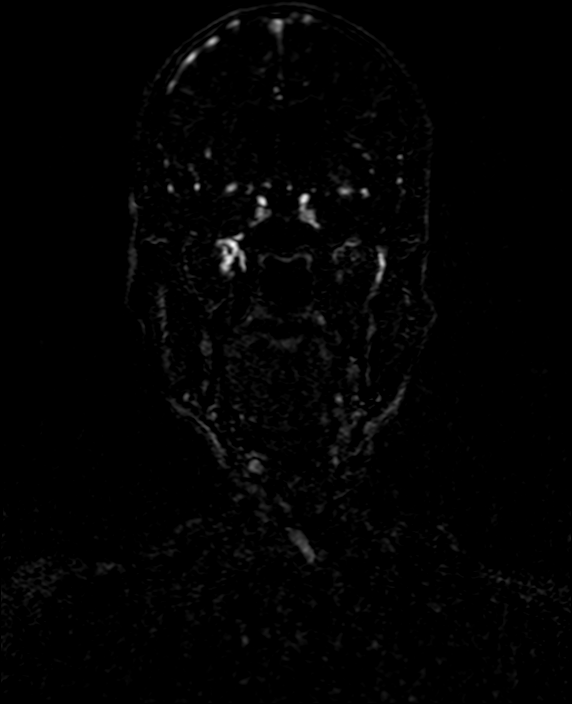

[Series 1183: full rotate · axial · 0.5mm · 0.58mm/px · 1 of 1 slices shown]
[im 1/1]
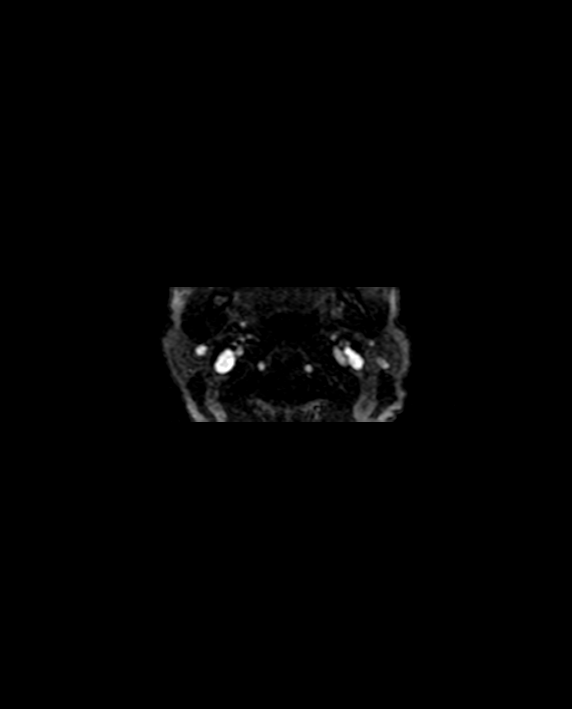

[12 of 48 positions shown; findings below may reference images not displayed]

DIAGNOSTIC STUDIES

EXAM

MRI of the brain with and without contrast, MR angiography of the head and neck.

INDICATION

concussion
PT GOT DIZZY, PASSED OUT, HIT HEAD, REQUIRING STITCHES 2 WEEKS AGO.  CONTINUED DIZZINESS.  15 ML
[facility] RG

TECHNIQUE

Sagittal axial coronal images were obtained with variable T1 and T2 weighting before after
administration of gadolinium. This includes 3D  time-of-flight imaging.

COMPARISONS

None available

FINDINGS

MR head:
There are no abnormal signal properties of the brainstem or visualized cervical cord. The expected
signal voids are noted throughout the major intracranial vascular structures. There is mucosal
thickening of the paranasal sinuses particularly the maxillary sinuses with subtotal opacification
of the right maxillary sinus. Minimal fluid is noted within the mastoid air cells bilaterally.

There is diffuse cortical atrophy with minimal small vessel ischemic changes. No acute ischemia is
seen on diffusion-weighted imaging.

There is no intracranial mass or hemorrhage.

MR angiography of the brain: Normal flow is noted throughout the vertebral basilar system and
cavernous carotid arteries. No aneurysm or vascular malformations are seen. No filling defects or
vessel cutoff is seen throughout the visualized intracranial vasculature.

MR angiography of the neck:
Three-vessel aortic arch is noted. Normal flow is noted throughout the vertebral arteries. There is
no evidence for significant stenosis throughout either carotid artery.

IMPRESSION

Cortical atrophy and proportional enlargement of the ventricular system. There is minimal small
vessel ischemic disease with no intracranial mass or hemorrhage.

No evidence for stenosis throughout the cervical carotid arteries.

The visualized intracranial arterial anatomy is within normal limits.

Inflammatory changes of the paranasal sinuses and mastoid air cells. Correlation with clinical
symptoms is recommended.

Tech Notes:

PT GOT DIZZY, PASSED OUT, HIT HEAD, REQUIRING STITCHES 2 WEEKS AGO.  CONTINUED DIZZINESS.  15 ML
[facility] RG

## 2021-11-27 IMAGING — MR AGHEADWO
1 of 5 series · 1 of 48 positions shown · non-contrast
Comparison: none

[Series 1125: full rotate · axial · 266.1mm · 1.00mm/px · 1 of 1 slices shown]
[im 1/1]
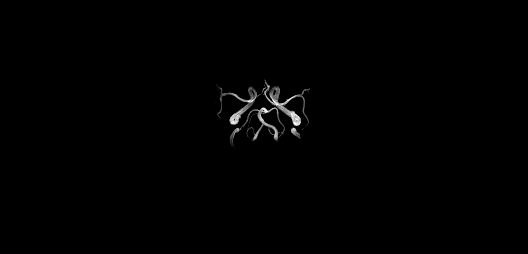

[1 of 48 positions shown; findings below may reference images not displayed]

DIAGNOSTIC STUDIES

EXAM

MRI of the brain with and without contrast, MR angiography of the head and neck.

INDICATION

concussion
PT GOT DIZZY, PASSED OUT, HIT HEAD, REQUIRING STITCHES 2 WEEKS AGO.  CONTINUED DIZZINESS.  15 ML
[facility]IST RG

TECHNIQUE

Sagittal axial coronal images were obtained with variable T1 and T2 weighting before after
administration of gadolinium. This includes 3D  time-of-flight imaging.

COMPARISONS

None available

FINDINGS

MR head:
There are no abnormal signal properties of the brainstem or visualized cervical cord. The expected
signal voids are noted throughout the major intracranial vascular structures. There is mucosal
thickening of the paranasal sinuses particularly the maxillary sinuses with subtotal opacification
of the right maxillary sinus. Minimal fluid is noted within the mastoid air cells bilaterally.

There is diffuse cortical atrophy with minimal small vessel ischemic changes. No acute ischemia is
seen on diffusion-weighted imaging.

There is no intracranial mass or hemorrhage.

MR angiography of the brain: Normal flow is noted throughout the vertebral basilar system and
cavernous carotid arteries. No aneurysm or vascular malformations are seen. No filling defects or
vessel cutoff is seen throughout the visualized intracranial vasculature.

MR angiography of the neck:
Three-vessel aortic arch is noted. Normal flow is noted throughout the vertebral arteries. There is
no evidence for significant stenosis throughout either carotid artery.

IMPRESSION

Cortical atrophy and proportional enlargement of the ventricular system. There is minimal small
vessel ischemic disease with no intracranial mass or hemorrhage.

No evidence for stenosis throughout the cervical carotid arteries.

The visualized intracranial arterial anatomy is within normal limits.

Inflammatory changes of the paranasal sinuses and mastoid air cells. Correlation with clinical
symptoms is recommended.

Tech Notes:

PT GOT DIZZY, PASSED OUT, HIT HEAD, REQUIRING STITCHES 2 WEEKS AGO.  CONTINUED DIZZINESS.  RG

## 2023-04-02 DEATH — deceased
